# Patient Record
Sex: Female | Born: 1945 | Race: White | Hispanic: No | Marital: Married | Smoking: Current some day smoker
Health system: Southern US, Community
[De-identification: ages and names within clinical notes are randomized; demographics above are authoritative.]

## PROBLEM LIST (undated history)

## (undated) DIAGNOSIS — R413 Other amnesia: Secondary | ICD-10-CM

## (undated) DIAGNOSIS — M8580 Other specified disorders of bone density and structure, unspecified site: Secondary | ICD-10-CM

## (undated) DIAGNOSIS — J302 Other seasonal allergic rhinitis: Secondary | ICD-10-CM

## (undated) DIAGNOSIS — M5126 Other intervertebral disc displacement, lumbar region: Secondary | ICD-10-CM

## (undated) DIAGNOSIS — I10 Essential (primary) hypertension: Principal | ICD-10-CM

## (undated) DIAGNOSIS — K2970 Gastritis, unspecified, without bleeding: Secondary | ICD-10-CM

## (undated) DIAGNOSIS — J449 Chronic obstructive pulmonary disease, unspecified: Secondary | ICD-10-CM

## (undated) DIAGNOSIS — I712 Thoracic aortic aneurysm, without rupture: Secondary | ICD-10-CM

## (undated) DIAGNOSIS — E785 Hyperlipidemia, unspecified: Secondary | ICD-10-CM

## (undated) DIAGNOSIS — F17200 Nicotine dependence, unspecified, uncomplicated: Secondary | ICD-10-CM

## (undated) DIAGNOSIS — M720 Palmar fascial fibromatosis [Dupuytren]: Secondary | ICD-10-CM

## (undated) HISTORY — PX: CHOLECYSTECTOMY: SHX55

## (undated) HISTORY — DX: Gastritis, unspecified, without bleeding: K29.70

## (undated) HISTORY — DX: Other amnesia: R41.3

## (undated) HISTORY — DX: Other seasonal allergic rhinitis: J30.2

## (undated) HISTORY — DX: Thoracic aortic aneurysm, without rupture: I71.2

## (undated) HISTORY — DX: Chronic obstructive pulmonary disease, unspecified: J44.9

## (undated) HISTORY — DX: Essential (primary) hypertension: I10

## (undated) HISTORY — DX: Hyperlipidemia, unspecified: E78.5

## (undated) HISTORY — DX: Other specified disorders of bone density and structure, unspecified site: M85.80

## (undated) HISTORY — DX: Other intervertebral disc displacement, lumbar region: M51.26

## (undated) HISTORY — PX: ABDOMINAL HYSTERECTOMY: SHX81

## (undated) HISTORY — DX: Palmar fascial fibromatosis (dupuytren): M72.0

## (undated) HISTORY — PX: OTHER SURGICAL HISTORY: SHX169

## (undated) HISTORY — DX: Nicotine dependence, unspecified, uncomplicated: F17.200

---

## 1978-12-10 HISTORY — PX: BREAST CYST EXCISION: SHX579

## 1998-07-08 ENCOUNTER — Other Ambulatory Visit: Admission: RE | Admit: 1998-07-08 | Discharge: 1998-07-08 | Payer: Self-pay | Admitting: Obstetrics and Gynecology

## 1998-09-09 DIAGNOSIS — M5126 Other intervertebral disc displacement, lumbar region: Secondary | ICD-10-CM

## 1998-09-09 DIAGNOSIS — M51369 Other intervertebral disc degeneration, lumbar region without mention of lumbar back pain or lower extremity pain: Secondary | ICD-10-CM

## 1998-09-09 DIAGNOSIS — M5136 Other intervertebral disc degeneration, lumbar region: Secondary | ICD-10-CM

## 1998-09-09 HISTORY — DX: Other intervertebral disc degeneration, lumbar region without mention of lumbar back pain or lower extremity pain: M51.369

## 1998-09-09 HISTORY — DX: Other intervertebral disc degeneration, lumbar region: M51.36

## 1998-09-09 HISTORY — DX: Other intervertebral disc displacement, lumbar region: M51.26

## 1999-07-18 ENCOUNTER — Other Ambulatory Visit: Admission: RE | Admit: 1999-07-18 | Discharge: 1999-07-18 | Payer: Self-pay | Admitting: Obstetrics and Gynecology

## 1999-09-10 HISTORY — PX: OTHER SURGICAL HISTORY: SHX169

## 1999-10-10 ENCOUNTER — Encounter (INDEPENDENT_AMBULATORY_CARE_PROVIDER_SITE_OTHER): Payer: Self-pay | Admitting: Specialist

## 1999-10-11 ENCOUNTER — Inpatient Hospital Stay (HOSPITAL_COMMUNITY): Admission: RE | Admit: 1999-10-11 | Discharge: 1999-10-13 | Payer: Self-pay | Admitting: Obstetrics and Gynecology

## 2000-04-05 ENCOUNTER — Other Ambulatory Visit: Admission: RE | Admit: 2000-04-05 | Discharge: 2000-04-05 | Payer: Self-pay | Admitting: Family Medicine

## 2000-06-17 ENCOUNTER — Ambulatory Visit (HOSPITAL_BASED_OUTPATIENT_CLINIC_OR_DEPARTMENT_OTHER): Admission: RE | Admit: 2000-06-17 | Discharge: 2000-06-17 | Payer: Self-pay | Admitting: Family Medicine

## 2004-11-20 ENCOUNTER — Ambulatory Visit: Payer: Self-pay | Admitting: Family Medicine

## 2004-11-29 ENCOUNTER — Other Ambulatory Visit: Admission: RE | Admit: 2004-11-29 | Discharge: 2004-11-29 | Payer: Self-pay | Admitting: Family Medicine

## 2004-11-29 ENCOUNTER — Ambulatory Visit: Payer: Self-pay | Admitting: Family Medicine

## 2004-12-21 ENCOUNTER — Ambulatory Visit: Payer: Self-pay | Admitting: Family Medicine

## 2005-01-09 ENCOUNTER — Ambulatory Visit: Payer: Self-pay | Admitting: Family Medicine

## 2005-01-26 ENCOUNTER — Ambulatory Visit: Payer: Self-pay | Admitting: Family Medicine

## 2005-03-21 ENCOUNTER — Ambulatory Visit: Payer: Self-pay | Admitting: Unknown Physician Specialty

## 2005-07-17 ENCOUNTER — Ambulatory Visit: Payer: Self-pay | Admitting: Family Medicine

## 2006-02-12 ENCOUNTER — Encounter: Payer: Self-pay | Admitting: Family Medicine

## 2006-02-12 ENCOUNTER — Other Ambulatory Visit: Admission: RE | Admit: 2006-02-12 | Discharge: 2006-02-12 | Payer: Self-pay | Admitting: Family Medicine

## 2006-02-12 ENCOUNTER — Ambulatory Visit: Payer: Self-pay | Admitting: Family Medicine

## 2006-02-12 LAB — CONVERTED CEMR LAB: Pap Smear: NORMAL

## 2006-04-11 ENCOUNTER — Ambulatory Visit: Payer: Self-pay | Admitting: Family Medicine

## 2006-05-23 ENCOUNTER — Ambulatory Visit: Payer: Self-pay | Admitting: Family Medicine

## 2006-07-09 ENCOUNTER — Ambulatory Visit: Payer: Self-pay | Admitting: Family Medicine

## 2006-09-04 ENCOUNTER — Emergency Department: Payer: Self-pay | Admitting: Emergency Medicine

## 2006-09-04 ENCOUNTER — Other Ambulatory Visit: Payer: Self-pay

## 2006-09-06 ENCOUNTER — Ambulatory Visit: Payer: Self-pay | Admitting: Family Medicine

## 2006-09-23 ENCOUNTER — Ambulatory Visit: Payer: Self-pay

## 2006-11-22 ENCOUNTER — Ambulatory Visit: Payer: Self-pay | Admitting: Family Medicine

## 2007-03-10 ENCOUNTER — Ambulatory Visit: Payer: Self-pay | Admitting: Family Medicine

## 2007-03-20 ENCOUNTER — Ambulatory Visit: Payer: Self-pay | Admitting: Family Medicine

## 2007-03-24 ENCOUNTER — Encounter: Payer: Self-pay | Admitting: Family Medicine

## 2007-03-24 DIAGNOSIS — J449 Chronic obstructive pulmonary disease, unspecified: Secondary | ICD-10-CM | POA: Insufficient documentation

## 2007-03-24 DIAGNOSIS — M949 Disorder of cartilage, unspecified: Secondary | ICD-10-CM

## 2007-03-24 DIAGNOSIS — J309 Allergic rhinitis, unspecified: Secondary | ICD-10-CM | POA: Insufficient documentation

## 2007-03-24 DIAGNOSIS — M899 Disorder of bone, unspecified: Secondary | ICD-10-CM | POA: Insufficient documentation

## 2007-03-24 DIAGNOSIS — E785 Hyperlipidemia, unspecified: Secondary | ICD-10-CM | POA: Insufficient documentation

## 2007-03-24 DIAGNOSIS — G4730 Sleep apnea, unspecified: Secondary | ICD-10-CM | POA: Insufficient documentation

## 2007-03-24 DIAGNOSIS — N6019 Diffuse cystic mastopathy of unspecified breast: Secondary | ICD-10-CM | POA: Insufficient documentation

## 2007-03-24 DIAGNOSIS — F17200 Nicotine dependence, unspecified, uncomplicated: Secondary | ICD-10-CM | POA: Insufficient documentation

## 2007-06-03 ENCOUNTER — Ambulatory Visit: Payer: Self-pay | Admitting: Internal Medicine

## 2007-06-03 ENCOUNTER — Telehealth (INDEPENDENT_AMBULATORY_CARE_PROVIDER_SITE_OTHER): Payer: Self-pay | Admitting: *Deleted

## 2007-08-16 ENCOUNTER — Emergency Department: Payer: Self-pay | Admitting: Emergency Medicine

## 2007-08-16 ENCOUNTER — Other Ambulatory Visit: Payer: Self-pay

## 2008-12-17 ENCOUNTER — Telehealth: Payer: Self-pay | Admitting: Family Medicine

## 2009-01-23 ENCOUNTER — Ambulatory Visit: Payer: Self-pay | Admitting: Internal Medicine

## 2009-03-31 ENCOUNTER — Encounter: Payer: Self-pay | Admitting: Family Medicine

## 2009-03-31 ENCOUNTER — Other Ambulatory Visit: Admission: RE | Admit: 2009-03-31 | Discharge: 2009-03-31 | Payer: Self-pay | Admitting: Family Medicine

## 2009-03-31 ENCOUNTER — Ambulatory Visit: Payer: Self-pay | Admitting: Family Medicine

## 2009-03-31 DIAGNOSIS — D126 Benign neoplasm of colon, unspecified: Secondary | ICD-10-CM | POA: Insufficient documentation

## 2009-04-06 ENCOUNTER — Encounter (INDEPENDENT_AMBULATORY_CARE_PROVIDER_SITE_OTHER): Payer: Self-pay | Admitting: *Deleted

## 2009-04-12 LAB — CONVERTED CEMR LAB
ALT: 19 units/L (ref 0–35)
CO2: 25 meq/L (ref 19–32)
Calcium: 10 mg/dL (ref 8.4–10.5)
Chloride: 105 meq/L (ref 96–112)
Cholesterol: 231 mg/dL — ABNORMAL HIGH (ref 0–200)
Lymphocytes Relative: 25 % (ref 12–46)
Lymphs Abs: 2.8 10*3/uL (ref 0.7–4.0)
Monocytes Relative: 6 % (ref 3–12)
Neutro Abs: 7.7 10*3/uL (ref 1.7–7.7)
Neutrophils Relative %: 69 % (ref 43–77)
Potassium: 4.8 meq/L (ref 3.5–5.3)
RBC: 5.61 M/uL — ABNORMAL HIGH (ref 3.87–5.11)
Sodium: 141 meq/L (ref 135–145)
Total Bilirubin: 0.6 mg/dL (ref 0.3–1.2)
Total Protein: 7.5 g/dL (ref 6.0–8.3)
VLDL: 34 mg/dL (ref 0–40)
Vit D, 25-Hydroxy: 75 ng/mL (ref 30–89)
WBC: 11.2 10*3/uL — ABNORMAL HIGH (ref 4.0–10.5)

## 2009-04-19 ENCOUNTER — Encounter: Payer: Self-pay | Admitting: Family Medicine

## 2009-06-01 ENCOUNTER — Ambulatory Visit: Payer: Self-pay | Admitting: Family Medicine

## 2009-06-07 LAB — CONVERTED CEMR LAB
BUN: 14 mg/dL (ref 6–23)
Chloride: 105 meq/L (ref 96–112)
Eosinophils Relative: 3 % (ref 0–5)
HCT: 48.4 % — ABNORMAL HIGH (ref 36.0–46.0)
HDL: 38 mg/dL — ABNORMAL LOW (ref >39)
LDL Cholesterol: 86 mg/dL (ref 0–99)
Lymphocytes Relative: 30 % (ref 12–46)
Lymphs Abs: 2.1 10*3/uL (ref 0.7–4.0)
Platelets: 265 10*3/uL (ref 150–400)
Potassium: 4.5 meq/L (ref 3.5–5.3)
Sodium: 140 meq/L (ref 135–145)
Total CHOL/HDL Ratio: 4
Triglycerides: 143 mg/dL (ref <150)
VLDL: 29 mg/dL (ref 0–40)
WBC: 7 10*3/uL (ref 4.0–10.5)

## 2009-09-07 ENCOUNTER — Encounter: Payer: Self-pay | Admitting: Family Medicine

## 2009-10-13 ENCOUNTER — Ambulatory Visit: Payer: Self-pay | Admitting: Unknown Physician Specialty

## 2009-10-13 ENCOUNTER — Encounter: Payer: Self-pay | Admitting: Family Medicine

## 2009-10-13 LAB — HM COLONOSCOPY

## 2010-08-29 ENCOUNTER — Telehealth (INDEPENDENT_AMBULATORY_CARE_PROVIDER_SITE_OTHER): Payer: Self-pay | Admitting: *Deleted

## 2010-08-31 ENCOUNTER — Ambulatory Visit: Payer: Self-pay | Admitting: Family Medicine

## 2010-09-03 LAB — CONVERTED CEMR LAB
ALT: 22 units/L (ref 0–35)
AST: 22 units/L (ref 0–37)
Basophils Relative: 0.2 % (ref 0.0–3.0)
Bilirubin, Direct: 0.1 mg/dL (ref 0.0–0.3)
Chloride: 113 meq/L — ABNORMAL HIGH (ref 96–112)
Direct LDL: 146.9 mg/dL
Eosinophils Relative: 2.2 % (ref 0.0–5.0)
GFR calc non Af Amer: 72.47 mL/min (ref >60)
HCT: 47.9 % — ABNORMAL HIGH (ref 36.0–46.0)
Lymphs Abs: 2.2 10*3/uL (ref 0.7–4.0)
MCV: 86.8 fL (ref 78.0–100.0)
Monocytes Absolute: 0.6 10*3/uL (ref 0.1–1.0)
Monocytes Relative: 6.5 % (ref 3.0–12.0)
Neutrophils Relative %: 66.5 % (ref 43.0–77.0)
Potassium: 6 meq/L — ABNORMAL HIGH (ref 3.5–5.1)
RBC: 5.51 M/uL — ABNORMAL HIGH (ref 3.87–5.11)
Total CHOL/HDL Ratio: 5
Total Protein: 7.1 g/dL (ref 6.0–8.3)
VLDL: 46.8 mg/dL — ABNORMAL HIGH (ref 0.0–40.0)
WBC: 9 10*3/uL (ref 4.5–10.5)

## 2010-09-04 ENCOUNTER — Ambulatory Visit: Payer: Self-pay | Admitting: Family Medicine

## 2010-09-04 DIAGNOSIS — E875 Hyperkalemia: Secondary | ICD-10-CM | POA: Insufficient documentation

## 2010-09-04 DIAGNOSIS — R7309 Other abnormal glucose: Secondary | ICD-10-CM | POA: Insufficient documentation

## 2010-09-04 DIAGNOSIS — M720 Palmar fascial fibromatosis [Dupuytren]: Secondary | ICD-10-CM | POA: Insufficient documentation

## 2010-09-06 LAB — CONVERTED CEMR LAB
BUN: 15 mg/dL
CO2: 28 meq/L
Calcium: 9.4 mg/dL
Chloride: 108 meq/L
Creatinine, Ser: 0.64 mg/dL
Glucose, Bld: 97 mg/dL
Hgb A1c MFr Bld: 5.8 % — ABNORMAL HIGH
Potassium: 5.1 meq/L
Sodium: 143 meq/L

## 2010-10-02 ENCOUNTER — Ambulatory Visit: Payer: Self-pay | Admitting: Family Medicine

## 2010-10-02 DIAGNOSIS — R1013 Epigastric pain: Secondary | ICD-10-CM | POA: Insufficient documentation

## 2010-10-03 LAB — CONVERTED CEMR LAB
ALT: 16 units/L (ref 0–35)
Basophils Absolute: 0 10*3/uL (ref 0.0–0.1)
Basophils Relative: 0 % (ref 0–1)
Bilirubin, Direct: 0.1 mg/dL (ref 0.0–0.3)
Eosinophils Absolute: 0.2 10*3/uL (ref 0.0–0.7)
Indirect Bilirubin: 0.4 mg/dL (ref 0.0–0.9)
Lipase: 98 units/L — ABNORMAL HIGH (ref 0–75)
MCHC: 32.7 g/dL (ref 30.0–36.0)
MCV: 87.6 fL (ref 78.0–100.0)
Monocytes Relative: 8 % (ref 3–12)
Neutro Abs: 6.7 10*3/uL (ref 1.7–7.7)
Neutrophils Relative %: 70 % (ref 43–77)
Platelets: 307 10*3/uL (ref 150–400)
RBC: 5.31 M/uL — ABNORMAL HIGH (ref 3.87–5.11)
RDW: 12.9 % (ref 11.5–15.5)
Total Bilirubin: 0.5 mg/dL (ref 0.3–1.2)

## 2010-10-12 ENCOUNTER — Ambulatory Visit: Payer: Self-pay | Admitting: Internal Medicine

## 2010-10-12 ENCOUNTER — Telehealth: Payer: Self-pay | Admitting: Family Medicine

## 2010-10-12 DIAGNOSIS — J209 Acute bronchitis, unspecified: Secondary | ICD-10-CM | POA: Insufficient documentation

## 2011-01-09 ENCOUNTER — Telehealth: Payer: Self-pay | Admitting: Family Medicine

## 2011-01-09 NOTE — Assessment & Plan Note (Signed)
Summary: CPX/CLE   Vital Signs:  Patient profile:   65 year old female Height:      63.5 inches Weight:      162 pounds BMI:     28.35 Temp:     97.8 degrees F oral Pulse rate:   88 / minute Pulse rhythm:   regular BP sitting:   128 / 76  (left arm) Cuff size:   regular  Vitals Entered By: Lewanda Rife LPN (September 04, 2010 10:40 AM) CC: CPX LMP hyst   History of Present Illness: here for wellness exam and to review chronic med problems  feeling pretty good -- stressed and tired in general as usual  [place] an important part of her life  is down about it - but it seems surreal   is really frustrated   has hx of opthy migraine - and now occasionally has pressure/ drawing feeling over eyes - brief at times  gets aura without headache  then brief double vision    wt is up 10 lb with bmi of 28  lipids are a bit high with trig 234 and HDL 39 and LDL 146  (up significantly) is not eating well -- is eating convenience foods (after a wt loss effort of cutting back)  last 2 weeks -- off the wagon    smoking status -- has cut back to 1/2ppd - then up to 1pack per stress   osteopenia with worse dexa in 5/10 ca and D vit D level is 43  HB is 16.5 - smoker   hyst in past for fibroids  pap nl 4/10-- no symptoms or problem   mam was at [facility] was fine spring or summer self exam - fibrocystic / cysts drained in the past    TD 07 ptx 2010 flu shot --will get at [facility] work zoster status --? if can afford  colonosc 11/10- nl but needs 5 y f/u for hx of polyps  electrolytes out of whatck with na 151 and K of 6  is drinking a good amt of fluids - 6 servings of fluid per day  sugar 118   problems with her hand -knots in palms L wrist hurts occas   needs a tranquilzer for stress for emergencies      Allergies (verified): 1)  ! Codeine  Past History:  Past Surgical History: Last updated:  10/27/2009 Cholecystectomy Hysterectomy- fibroids (09/1999) L3-L4, L4-L5 bulging disks (09/1998) 2 breast cyst aspirations Sleep study- mild apnea (06/2000) Osteopenia- Dexa (09/2002) Dexa- osteopenia worse (12/2004) EGD- gastritis (03/2005) Colonoscopy- polyps, hemorrhoids Nuclear stress test- neg. (09/2006) dexa (5/10)- osteopenia- slt worse  11/10 EGD- gastritis 11/10 colonoscopy - diverticulosis - re check 5 y  Family History: Last updated: 03/24/2007 mother breast ca, CAD  Social History: Last updated: 03/31/2009 Patient currently smokes.  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 Eyes:  Denies blurring and eye irritation. CV:  Denies chest pain or discomfort, palpitations, shortness of breath with exertion, and swelling of feet. Resp:  Denies cough, shortness of breath, and wheezing. GI:  Denies abdominal pain, bloody stools, and change in bowel habits. GU:  Denies dysuria and urinary frequency. MS:  Denies joint pain, joint redness, and joint swelling. Derm:  Denies lesion(s), poor wound healing, and rash. Neuro:  Denies numbness and tingling. Psych:  Denies anxiety and depression. Endo:  Denies cold intolerance, excessive thirst, excessive urination, and heat intolerance. Heme:  Denies abnormal bruising and bleeding.  Physical Exam  General:  Well-developed,well-nourished,in no acute distress; alert,appropriate and cooperative throughout examination Head:  normocephalic, atraumatic, and no abnormalities observed.   Eyes:  vision grossly intact, pupils equal, pupils round, and pupils reactive to light.  no conjunctival pallor, injection or icterus  Ears:  R ear normal and L  ear normal.   Nose:  no nasal discharge.   Mouth:  pharynx pink and moist.   Neck:  supple with full rom and no masses or thyromegally, no JVD or carotid bruit  Chest Wall:  No deformities, masses, or tenderness noted. Breasts:  No mass, nodules, thickening, tenderness, bulging, retraction, inflamation, nipple discharge or skin changes noted.   Lungs:  diffusely distant bs with fair air exch no rales/rhonchi or wheeze  Heart:  Normal rate and regular rhythm. S1 and S2 normal without gallop, murmur, click, rub or other extra sounds. Abdomen:  Bowel sounds positive,abdomen soft and non-tender without masses, organomegaly or hernias noted. no renal bruits  Msk:  changes in palms with hardening of tissue over tendons no acute joint changes  Pulses:  R and L carotid,radial,femoral,dorsalis pedis and posterior tibial pulses are full and equal bilaterally Extremities:  No clubbing, cyanosis, edema, or deformity noted with normal full range of motion of all joints.   Neurologic:  sensation intact to light touch, gait normal, and DTRs symmetrical and normal.   Skin:  Intact without suspicious lesions or rashes Cervical Nodes:  No lymphadenopathy noted Inguinal Nodes:  No significant adenopathy Psych:  normal affect, talkative and pleasant    Impression & Recommendations:  Problem # 1:  HEALTH MAINTENANCE EXAM (ICD-V70.0) Assessment Comment Only reviewed health habits including diet, exercise and skin cancer prevention reviewed health maintenance list and family history adv to quit smoking/ get back to healthy diet and exercise  rev labs in detail  Problem # 2:  HYPERKALEMIA (ICD-276.7) Assessment: New odd electrolyte profile-- ? lab error- no reason for high na and K re check these  Orders: Venipuncture ([address]) Specimen Handling (60454) T-Basic Metabolic Panel ([id number])  Problem # 3:  HYPERGLYCEMIA (ICD-790.29) Assessment: New fasting sugar 118- worse diet lately check AIC  today Orders: Venipuncture (29562) Specimen Handling (13086) T- Hemoglobin A1C ([id number])  Problem # 4:  Hx of BREAST CANCER, FAMILY HX (ICD-V16.3) Assessment: Comment Only will send for mam no changes in exam today  Problem # 5:  Hx of TOBACCO ABUSE (ICD-305.1) Assessment: Unchanged discussed in detail risks of smoking, and possible outcomes including COPD, vascular dz, cancer and also respiratory infections/sinus problems   pt aware of risks  not willing to quit at this time  Problem # 6:  HYPERLIPIDEMIA (ICD-272.4) Assessment: Deteriorated  this is worse with poor diet rev low sat fat diet to follow will continue to monitor  also plans to get back to exercise  Labs Reviewed: SGOT: 22 (08/31/2010)   SGPT: 22 (08/31/2010)   HDL:39.90 (08/31/2010), 38 (06/01/2009)  LDL:86 (06/01/2009), 156 (28/41/3244)  Chol:219 (08/31/2010), 153 (06/01/2009)  Trig:234.0 (08/31/2010), 143 (06/01/2009)  Problem # 7:  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 One half to one by mouth three times a day as needed 2)  Osteo Bi-flex Adv Double St Caps (Misc natural products) .... As needed 3)  Celebrex 200 Mg Caps (Celecoxib) .Marland Kitchen.. 1 by mouth once daily with food as needed 4)  Alprazolam 0.5 Mg Tabs (Alprazolam) .Marland Kitchen.. 1 by mouth once daily as needed severe anxiety  Patient Instructions: 1)  if you are interested in shingles shot -- please call your ins co to check on coverage and let us know if you want one 2)  re checking labs today 3)  please send for last mammogram report from [facility]  4)  don't forget to see your eye doctor for your eye symptoms 5)  cut the caffiene and drink more water  6)  re checking electrolytes and also AIC (diabetes test) today  7)  keep thinking about quitting smoking  8)   you can raise your HDL (good cholesterol) by increasing exercise and eating omega 3 fatty acid supplement like fish oil or flax seed oil over the counter 9)  you can lower LDL (bad cholesterol) by limiting saturated fats in diet like red meat, fried foods, egg yolks, fatty breakfast meats, high fat dairy products and shellfish  Prescriptions: ALPRAZOLAM 0.5 MG TABS (ALPRAZOLAM) 1 by mouth once daily as needed severe anxiety  #20 x 0   Entered and Authorized by:   Judith Part MD   Signed by:   Judith Part MD on 09/04/2010   Method used:   Print then Give to Patient   RxID:   [id number]   Current Allergies (reviewed today): ! CODEINE

## 2011-01-09 NOTE — Assessment & Plan Note (Signed)
Summary: upper stomach pain//low grade fever//lch   Vital Signs:  Patient profile:   65 year old female Height:      63.5 inches Weight:      157 pounds BMI:     27.47 Temp:     98.3 degrees F oral Pulse rate:   80 / minute Pulse rhythm:   regular BP sitting:   140 / 62  (left arm) Cuff size:   regular  Vitals Entered By: Linde Gillis CMA Duncan Dull) (October 02, 2010 9:09 AM) CC: stomach pain   History of Present Illness: 65 yo new to me here for stomach pain.  Has a h/o reflux but feels symptoms are worse than usual. Taking her Prilosec two times a day and Xantac which helps a little. Burping helps the most. Symptoms are epigastric and go up her throat but does not taste the acid that she usually does with her reflux. Has had more stress lately, [place].  Has been also been drinking a lot of diet drinks and she is intolerant of Splenda, causes GI upset reflux. No nausea or vomiting. No diarrhea. Did have some body aches over the weekend, ?fever Tmax 98.8.  symptoms are gradually getting better.      Allergies: 1)  ! Codeine  Review of Systems      See HPI General:  Complains of fever and malaise. GI:  Complains of gas and indigestion; denies bloody stools, change in bowel habits, nausea, and vomiting.  Physical Exam  General:  Well-developed,well-nourished,in no acute distress; alert,appropriate and cooperative throughout examination Abdomen:  Bowel sounds positive,abdomen soft and non-tender without masses, organomegaly or hernias noted. no renal bruits  Extremities:  No clubbing, cyanosis, edema, or deformity noted with normal full range of motion of all joints.   Psych:  normal affect, talkative and pleasant    Impression & Recommendations:  Problem # 1:  EPIGASTRIC PAIN (ICD-789.06) Assessment New Most consistent with GERD/PUD.  Advised increasing her PPI and if symptoms persist, needs follow up with her gastroenterologist to  repeat her EGD. Pt in agreement with plan. Will check Lipase, hepatic panel to rule out other causes that are less likely. Orders: Venipuncture ([id number]) Specimen Handling (82956) T-Lipase [phone]) T-Hepatic Function [phone]) T-CBC w/Diff [phone])  Complete Medication List: 1)  Cyclobenzaprine Hcl 10 Mg Tabs (Cyclobenzaprine hcl) .... One half to one by mouth three times a day as needed 2)  Osteo Bi-flex Adv Double St Caps (Misc natural products) .... As needed 3)  Celebrex 200 Mg Caps (Celecoxib) .Marland Kitchen.. 1 by mouth once daily with food as needed 4)  Alprazolam 0.5 Mg Tabs (Alprazolam) .Marland Kitchen.. 1 by mouth once daily as needed severe anxiety   Orders Added: 1)  Venipuncture [53664] 2)  Specimen Handling [99000] 3)  T-Lipase [[id number]] 4)  T-Hepatic Function [[id number]] 5)  T-CBC w/Diff [[id number]] 6)  Est. Patient Level IV [63875]    Current Allergies (reviewed today): ! CODEINE

## 2011-01-09 NOTE — Progress Notes (Signed)
----   Converted from flag ---- ---- 08/28/2010 7:32 PM, Colon Flattery Tower MD wrote: please check wellness/ lipid/ vit D v70.0 and 272, and 733.0 thanks   ---- 08/28/2010 12:18 PM, Liane Comber CMA (AAMA) wrote: Lab orders please! Good Morning! This pt is scheduled for cpx labs [place], which labs to draw and dx codes to use? Thanks Tasha ------------------------------

## 2011-01-09 NOTE — Progress Notes (Signed)
Summary: wants zpak  Phone Note Call from Patient Call back at Home Phone [phone]   Caller: Patient Call For: Judith Part MD Summary of Call: Patient has had low grade fever, coughing, and head congestion. She has been taking mucinex. She is asking if you could call in zpak. I explained to her that we do not treat with antibiotics without office visit, I offered her somthing for today. She says that any time she get symptoms like this it always turns into bronchitis and that she feels like she needs to start this early. Again I told her that we would need to set up an appt. and she told me to ask you about the antibiotic first. Uses [facility] if needed.  Initial call taken by: Melody Comas,  October 12, 2010 8:35 AM  Follow-up for Phone Call        sorry- cannot tx with abx without visit  Follow-up by: Judith Part MD,  October 12, 2010 9:00 AM  Additional Follow-up for Phone Call Additional follow up Details #1::        Called patient to let her know that she will need an appt. Scheduled appt. for today at 2:00.  Additional Follow-up by: Melody Comas,  October 12, 2010 9:14 AM

## 2011-01-09 NOTE — Assessment & Plan Note (Signed)
Summary: cough,congestion/alc   Vital Signs:  Patient profile:   65 year old female Weight:      159 pounds O2 Sat:      96 % on Room air Temp:     98.9 degrees F oral Pulse rate:   106 / minute Pulse rhythm:   regular BP sitting:   160 / 70  (left arm) Cuff size:   regular  Vitals Entered By: Selena Batten Dance CMA (AAMA) (October 12, 2010 2:03 PM)  O2 Flow:  Room air CC: Cough/Congestion   History of Present Illness: CC: cough/cold  2d h/o dry raspy cough, with phlegm taste.  + ST, raw throat.  Took mucinex yesterday, coughed up some phlegm.  Low grade fever last night to 99.6.  Hasn't tried anything other than mucinex.  Not more SOB than normal.  More sputum production and cough than normal.  Husband has been sick.  Pt smoking x >50 years, 1/3 ppd, has not cut back yet "not feeling bad enough".  usually gets bronchitis in spring, has been a while since had this.  No abd pain, n/v, rashes, joint pain, myalgias.  Feels needs Zpack within 12 hours of foul taste because if not invariably gets bronchitis.  Has grand daughter with autoimmune condition so she feels she needs to stay healthy.  Current Medications (verified): 1)  Cyclobenzaprine Hcl 10 Mg Tabs (Cyclobenzaprine Hcl) .... One Half To One By Mouth Three Times A Day As Needed 2)  Osteo Bi-Flex Adv Double St  Caps (Misc Natural Products) .... As Needed 3)  Celebrex 200 Mg Caps (Celecoxib) .Marland Kitchen.. 1 By Mouth Once Daily With Food As Needed 4)  Alprazolam 0.5 Mg Tabs (Alprazolam) .Marland Kitchen.. 1 By Mouth Once Daily As Needed Severe Anxiety  Allergies: 1)  ! 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  has 5 year old twin grandkids   Review of Systems       per HPI  Physical Exam  General:   Well-developed,well-nourished,in no acute distress; alert,appropriate and cooperative throughout examination, comfortable.  Head:  normocephalic, atraumatic, and no abnormalities observed.   Eyes:  vision grossly intact, pupils equal, pupils round, and pupils reactive to light.  no conjunctival pallor, injection or icterus  Ears:  R ear normal and L ear normal.   Nose:  no nasal discharge.   Mouth:  pharynx pink and moist.   Neck:  supple with full rom and no masses or thyromegally, no JVD or carotid bruit  Lungs:  coarse sounding throughout.  mild exp wheezing. Heart:  Normal rate and regular rhythm. S1 and S2 normal without gallop, murmur, click, rub or other extra sounds. Pulses:  2+ radpulses Extremities:  brisk cap refill Skin:  Intact without suspicious lesions or rashes   Impression & Recommendations:  Problem # 1:  ACUTE BRONCHITIS (ICD-466.0) given h/o smoking and lung findings, likely has early COPD.  + increased cough and sputum.  treat with abx. red flags to return discussed.  Her updated medication list for this problem includes:    Zithromax Z-pak 250 Mg Tabs (Azithromycin) .Marland Kitchen... Take as directed  Complete Medication List: 1)  Cyclobenzaprine Hcl 10 Mg Tabs (Cyclobenzaprine hcl) .... One half to one by mouth three times a day as needed 2)  Osteo Bi-flex Adv  Double St Caps (Misc natural products) .... As needed 3)  Celebrex 200 Mg Caps (Celecoxib) .Marland Kitchen.. 1 by mouth once daily with food as needed 4)  Alprazolam 0.5 Mg Tabs (Alprazolam) .Marland Kitchen.. 1 by mouth once daily as needed severe anxiety 5)  Zithromax Z-pak 250 Mg Tabs (Azithromycin) .... Take as directed  Patient Instructions: 1)  You may have early bronchitis.  Given your smoking history, we will treat you early for this.   2)  Use medication as prescribed: zpack. 3)  Continue delsym 4)  Please return if you are not improving as expected, or if you have high fevers (>101.5) or difficulty breathing, or more productive  cough. 5)  Call clinic with questions.  Pleasure to see you today!  Prescriptions: ZITHROMAX Z-PAK 250 MG TABS (AZITHROMYCIN) take as directed  #1 x 0   Entered and Authorized by:   Eustaquio Boyden  MD   Signed by:   Eustaquio Boyden  MD on 10/12/2010   Method used:   Electronically to        [facility]* (retail)       [address] [place], [place]  [address]       Ph: [phone]       Fax: [phone]   RxID:   [id number]    Orders Added: 1)  Est. Patient Level III [84132]    Current Allergies (reviewed today): ! CODEINE

## 2011-01-17 NOTE — Progress Notes (Signed)
Summary: Wants motion sickness patches  Phone Note Call from Patient Call back at [phone]   Caller: Patient Call For: Judith Part MD Summary of Call: Pt is going on cruise next wk for 8 days. Pt wants patches that go behind the ear for motion sickness. Pt uses [facility] and pt can be reached at [phone].Please advise.  Initial call taken by: Lewanda Rife LPN,  January 09, 2011 9:48 AM  Follow-up for Phone Call        px written on EMR for call in for scopalamine patch watch out for sedation with this  Follow-up by: Judith Part MD,  January 09, 2011 10:42 AM  Additional Follow-up for Phone Call Additional follow up Details #1::        Med sent electronically to [facility] and Patient notified as instructed by telephone. Lewanda Rife LPN  January 09, 2011 10:46 AM     New/Updated Medications: TRANSDERM-SCOP 1.5 MG PT72 (SCOPOLAMINE BASE) apply one patch every 3 days as needed motion sickness Prescriptions: TRANSDERM-SCOP 1.5 MG PT72 (SCOPOLAMINE BASE) apply one patch every 3 days as needed motion sickness  #10 x 0   Entered by:   Lewanda Rife LPN   Authorized by:   Judith Part MD   Signed by:   Lewanda Rife LPN on 19/14/7829   Method used:   Electronically to        [facility]* (retail)       [address] [place], [place]  [address]       Ph: [phone]       Fax: [phone]   RxID:   [id number]

## 2011-06-25 ENCOUNTER — Telehealth: Payer: Self-pay | Admitting: *Deleted

## 2011-06-25 MED ORDER — CYCLOBENZAPRINE HCL 10 MG PO TABS: 10.0000 mg | ORAL_TABLET | Freq: Three times a day (TID) | ORAL | Status: DC | PRN

## 2011-06-25 NOTE — Telephone Encounter (Signed)
I can call in short amt of flexeril - otherwise needs appt  If not imp in 2 d - follow up or if worse or any weakness or numbness

## 2011-06-25 NOTE — Telephone Encounter (Signed)
Pt has pulled a muscle in her back carrying her grandson.  She is asking that tramadol and flexeril be called to [facility].  I advised her that she may need office visit first.

## 2011-06-25 NOTE — Telephone Encounter (Signed)
Patient notified as instructed by telephone. Medication phoned to  [facility] as instructed.

## 2011-07-11 DIAGNOSIS — I712 Thoracic aortic aneurysm, without rupture, unspecified: Secondary | ICD-10-CM

## 2011-07-11 HISTORY — DX: Thoracic aortic aneurysm, without rupture: I71.2

## 2011-07-11 HISTORY — DX: Thoracic aortic aneurysm, without rupture, unspecified: I71.20

## 2011-07-24 ENCOUNTER — Ambulatory Visit (INDEPENDENT_AMBULATORY_CARE_PROVIDER_SITE_OTHER): Payer: Medicare Other | Admitting: Family Medicine

## 2011-07-24 ENCOUNTER — Ambulatory Visit (INDEPENDENT_AMBULATORY_CARE_PROVIDER_SITE_OTHER)
Admission: RE | Admit: 2011-07-24 | Discharge: 2011-07-24 | Disposition: A | Discharge status: Home / Self Care | Payer: Medicare Other | Source: Ambulatory Visit | Attending: Family Medicine | Admitting: Family Medicine

## 2011-07-24 ENCOUNTER — Encounter: Payer: Self-pay | Admitting: Family Medicine

## 2011-07-24 DIAGNOSIS — F17200 Nicotine dependence, unspecified, uncomplicated: Secondary | ICD-10-CM

## 2011-07-24 DIAGNOSIS — R05 Cough: Secondary | ICD-10-CM

## 2011-07-24 DIAGNOSIS — M549 Dorsalgia, unspecified: Secondary | ICD-10-CM | POA: Insufficient documentation

## 2011-07-24 DIAGNOSIS — R059 Cough, unspecified: Secondary | ICD-10-CM

## 2011-07-24 MED ORDER — CYCLOBENZAPRINE HCL 10 MG PO TABS: 10.0000 mg | ORAL_TABLET | Freq: Three times a day (TID) | ORAL | Status: AC | PRN

## 2011-07-24 NOTE — Progress Notes (Signed)
Subjective:    Patient ID: Ann Wu, female    DOB: 1946/04/21, 65 y.o.   MRN: [id number]  HPI Is having some L side pain Thinks she pulled muscle under L arm  Hurts to lie on that side for 4 weeks  Then a spot started to become tender to the touch (no knot and no rash )  Persisted and moved into her mid back and axilla and around shoulder blade  This am also into her L neck Used some biofreeze and some ibuprofen   (celebrex did not help --never takes the 2 together )   Had been coughing for 3 weeks prior to this -- ? If cough was the cause  Not wheezy but irritated  Feels like sinus drainage or phlegm in throat  Did start with a mild cold with drainage (mucinex helped)  Not coughing anything up except for once  No wheeze  Not sob  Smoking -- has cut down to 1/2ppd at the most  Not ready to set a date yet but doing well   Working on wt loss - intentional - by our scale 14 lb  Has mt for 6 months  Cutting back on eating -- protein bar in am and 1/2 sandwhich for lunch and dinner - smaller portions  Still eats occ cookie   Patient Active Problem List  Diagnoses  . COLONIC POLYPS  . HYPERLIPIDEMIA  . HYPERKALEMIA  . TOBACCO ABUSE  . ACUTE BRONCHITIS  . ALLERGIC RHINITIS  . COPD  . FIBROCYSTIC BREAST DISEASE  . DUPUYTREN'S CONTRACTURE  . OSTEOPENIA  . SLEEP APNEA, MILD  . EPIGASTRIC PAIN  . HYPERGLYCEMIA  . Cough  . Back pain   Past Medical History  Diagnosis Date  . Allergic rhinitis, seasonal     mild  . COPD (chronic obstructive pulmonary disease)     likely per CXR  . Hyperlipidemia   . Osteopenia   . Smoker   . Dupuytren's contracture of hand   . Bulging lumbar disc 10/99    L 3-4, L 4-5  . Osteopenia     dexa 2003, worse 2006, slighty worse 2010  . Gastritis     EGD 03/2005, 11/10   Past Surgical History  Procedure Date  . Cholecystectomy   . Gyn surgery 09/1999    hysterectomy- fibroids  . Cyst aspirations      x 2- breast   History    Substance Use Topics  . Smoking status: Current Everyday Smoker  . Smokeless tobacco: Not on file  . Alcohol Use: Not on file   Family History  Problem Relation Age of Onset  . Cancer Mother     breast  . Coronary artery disease Mother    Allergies  Allergen Reactions  . Codeine     REACTION: nausea and vomiting   Current Outpatient Prescriptions on File Prior to Visit  Medication Sig Dispense Refill  . cyclobenzaprine (FLEXERIL) 10 MG tablet Take 1 tablet (10 mg total) by mouth every 8 (eight) hours as needed for muscle spasms (caution of sedation).  10 tablet  0           Review of Systems Review of Systems  Constitutional: Negative for fever, appetite change, fatigue and unexpected weight change.  Eyes: Negative for pain and visual disturbance.  Respiratory: neg for sob/ wheeze/ pos for cough   Cardiovascular: Negative. For palpitations/ edema or sob  Gastrointestinal: Negative for nausea, diarrhea and constipation.  Genitourinary: Negative for urgency  and frequency.  Skin: Negative for pallor.  MSK pos for back and neck and side pain  Neurological: Negative for weakness, light-headedness, numbness and headaches.  Hematological: Negative for adenopathy. Does not bruise/bleed easily.  Psychiatric/Behavioral: Negative for dysphoric mood. The patient is not nervous/anxious.          Objective:   Physical Exam  Constitutional: She appears well-developed and well-nourished.       Wt loss noted  Well appearing   HENT:  Head: Normocephalic and atraumatic.  Right Ear: External ear normal.  Left Ear: External ear normal.  Nose: Nose normal.  Mouth/Throat: Oropharynx is clear and moist.       Some clear posst nasal drip  Eyes: Conjunctivae and EOM are normal. Pupils are equal, round, and reactive to light. Right eye exhibits no discharge. Left eye exhibits no discharge.  Neck: Normal range of motion. Neck supple. No JVD present. Carotid bruit is not present. No  thyromegaly present.       Nl rom of neck No bony or muscular tenderness   Cardiovascular: Normal rate, regular rhythm and normal heart sounds.   Pulmonary/Chest: Breath sounds normal. No respiratory distress. She has no rales. She exhibits tenderness.       Diffusely distant bs  Harsh at bilat bases with scant wheeze on forced exp only  Cough is harsh sounding  Tubular bs louder at L base also   Some lateral lower L rib tenderness without crepitance or skin change   Abdominal: Soft. Bowel sounds are normal. She exhibits no distension and no mass. There is no tenderness.  Musculoskeletal: Normal range of motion. She exhibits tenderness. She exhibits no edema.       Tenderness in musculature medial to L scapula and L trapezius No bony tenderness  Lymphadenopathy:    She has no cervical adenopathy.  Neurological: She is alert. She has normal reflexes. No cranial nerve deficit.  Skin: Skin is warm and dry. No rash noted. No erythema. No pallor.  Psychiatric: She has a normal mood and affect.          Assessment & Plan:

## 2011-07-24 NOTE — Patient Instructions (Signed)
Take flexeril as needed (watch for sedation ) for your side pain Continue stretching and use heat as needed (10 minutes at a time)  Anti inflammatory only as tolerated  Chest x ray today -- will update you tomorrow with result Start taking prilosec regularly for the next 2 weeks

## 2011-07-24 NOTE — Assessment & Plan Note (Addendum)
Cough for 3 weeks in smoker who also has gerd  Non productive  Harsh bs on exam  Adv to re start prilosec regularly  Chest x ray today and update

## 2011-07-24 NOTE — Assessment & Plan Note (Signed)
Commended on cutting down and plan to quit Disc in detail risks of smoking and possible outcomes including copd, vascular/ heart disease, cancer , respiratory and sinus infections  Pt voices understanding cxr today

## 2011-07-24 NOTE — Assessment & Plan Note (Addendum)
In L side - scapular and rib area as well as lateral chest wall  Some tenderness and tightness cxr today  Trial of flexeril and heat - and update Pt will continue to stretch area  Suspect muscle strain but pleurisy also in differential (although symptoms are not pleuritic) Consider PT if needed

## 2011-07-26 ENCOUNTER — Telehealth: Payer: Self-pay | Admitting: Family Medicine

## 2011-07-26 DIAGNOSIS — R05 Cough: Secondary | ICD-10-CM

## 2011-07-26 DIAGNOSIS — R9389 Abnormal findings on diagnostic imaging of other specified body structures: Secondary | ICD-10-CM

## 2011-07-26 DIAGNOSIS — F17200 Nicotine dependence, unspecified, uncomplicated: Secondary | ICD-10-CM

## 2011-07-26 DIAGNOSIS — R059 Cough, unspecified: Secondary | ICD-10-CM

## 2011-07-26 DIAGNOSIS — R7309 Other abnormal glucose: Secondary | ICD-10-CM

## 2011-07-26 NOTE — Telephone Encounter (Signed)
She will need bun and cr - please schedule for draw- I did the order I did the order for CT

## 2011-07-26 NOTE — Telephone Encounter (Signed)
Message copied by Judy Pimple on Thu Jul 26, 2011 10:17 PM ------      Message from: Patience Musca      Created: Wed Jul 25, 2011  5:48 PM      Regarding: CT scan referral       Please see result note 07/25/11.      ----- Message -----         From: Roxy Manns, MD         Sent: 07/24/2011   8:21 PM           To: Yetta Glassman, LPN            Please let pt know cxr shows an irregular contour of the middle of the chest on L as well as pleural thickening (that could be consistent with the pleurisy possibility we discussed)       In order to see more closely - a CT scan with contrast is recommended       Please ask if she is agreeable to it -- would show the mid chest better

## 2011-07-27 ENCOUNTER — Other Ambulatory Visit (INDEPENDENT_AMBULATORY_CARE_PROVIDER_SITE_OTHER): Payer: Medicare Other | Admitting: Family Medicine

## 2011-07-27 DIAGNOSIS — R7309 Other abnormal glucose: Secondary | ICD-10-CM

## 2011-07-27 DIAGNOSIS — R918 Other nonspecific abnormal finding of lung field: Secondary | ICD-10-CM

## 2011-07-27 DIAGNOSIS — R9389 Abnormal findings on diagnostic imaging of other specified body structures: Secondary | ICD-10-CM

## 2011-07-27 NOTE — Telephone Encounter (Signed)
Spoke to pt she will come in today for labs and we will set up CT scan after lab appt.

## 2011-08-02 ENCOUNTER — Ambulatory Visit (INDEPENDENT_AMBULATORY_CARE_PROVIDER_SITE_OTHER)
Admission: RE | Admit: 2011-08-02 | Discharge: 2011-08-02 | Disposition: A | Discharge status: Home / Self Care | Payer: Medicare Other | Source: Ambulatory Visit | Attending: Family Medicine | Admitting: Family Medicine

## 2011-08-02 DIAGNOSIS — F17200 Nicotine dependence, unspecified, uncomplicated: Secondary | ICD-10-CM

## 2011-08-02 DIAGNOSIS — R9389 Abnormal findings on diagnostic imaging of other specified body structures: Secondary | ICD-10-CM

## 2011-08-02 DIAGNOSIS — R05 Cough: Secondary | ICD-10-CM

## 2011-08-02 DIAGNOSIS — R059 Cough, unspecified: Secondary | ICD-10-CM

## 2011-08-02 DIAGNOSIS — R918 Other nonspecific abnormal finding of lung field: Secondary | ICD-10-CM

## 2011-08-02 MED ORDER — IOHEXOL 300 MG/ML  SOLN
80.0000 mL | Freq: Once | INTRAMUSCULAR | Status: AC | PRN
  Administered 2011-08-02: 80 mL via INTRAVENOUS

## 2011-08-07 ENCOUNTER — Encounter: Payer: Self-pay | Admitting: Family Medicine

## 2011-08-07 ENCOUNTER — Ambulatory Visit (INDEPENDENT_AMBULATORY_CARE_PROVIDER_SITE_OTHER): Payer: Medicare Other | Admitting: Family Medicine

## 2011-08-07 VITALS — BP 150/80 | HR 92 | Temp 98.5°F | Ht 63.5 in | Wt 143.5 lb

## 2011-08-07 DIAGNOSIS — R059 Cough, unspecified: Secondary | ICD-10-CM

## 2011-08-07 DIAGNOSIS — I719 Aortic aneurysm of unspecified site, without rupture: Secondary | ICD-10-CM | POA: Insufficient documentation

## 2011-08-07 DIAGNOSIS — J449 Chronic obstructive pulmonary disease, unspecified: Secondary | ICD-10-CM

## 2011-08-07 DIAGNOSIS — R05 Cough: Secondary | ICD-10-CM

## 2011-08-07 DIAGNOSIS — E785 Hyperlipidemia, unspecified: Secondary | ICD-10-CM

## 2011-08-07 DIAGNOSIS — F17200 Nicotine dependence, unspecified, uncomplicated: Secondary | ICD-10-CM

## 2011-08-07 DIAGNOSIS — M549 Dorsalgia, unspecified: Secondary | ICD-10-CM

## 2011-08-07 NOTE — Assessment & Plan Note (Signed)
Chol is high and pt has obvious atherosclerosis of aorta -- with ulceration  Need to get this down  Intol of statins thus far May need to at least attempt qod dosing  Pt will call with last me tried

## 2011-08-07 NOTE — Progress Notes (Signed)
Subjective:    Patient ID: Ann Wu, female    DOB: 1946-11-10, 65 y.o.   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 and 2 suspected athrosclerotic ulcers (no dissection) No aneurysm in family but mom died of MI    Also small pleural eff and some emphysema  Enc last visit to start prilosec  Is still at 1/2 ppd  Plans to quit today  Will go to accupuncture for that   Pain is overall better - is reassured by that -- still just a little sore if she lies on it  Muscle relaxer really helped -- happy about that  Was spiking a low grade fever at night - and that went away   Cough is much better -- took prilosec daily for a while and that helped  Then post nasal drip became more of a problem   Patient Active Problem List  Diagnoses  . COLONIC POLYPS  . HYPERLIPIDEMIA  . HYPERKALEMIA  . TOBACCO ABUSE  . ALLERGIC RHINITIS  . COPD  . FIBROCYSTIC BREAST DISEASE  . DUPUYTREN'S CONTRACTURE  . OSTEOPENIA  . SLEEP APNEA, MILD  . HYPERGLYCEMIA  . Cough  . Back pain  . Abnormal chest x-ray  . Aneurysm of aorta   Past Medical History  Diagnosis Date  . Allergic rhinitis, seasonal     mild  . COPD (chronic obstructive pulmonary disease)     likely per CXR  . Hyperlipidemia   . Osteopenia   . Smoker   . Dupuytren's contracture of hand   . Bulging lumbar disc 10/99    L 3-4, L 4-5  . Osteopenia     dexa 2003, worse 2006, slighty worse 2010  . Gastritis     EGD 03/2005, 11/10   Past Surgical History  Procedure Date  . Cholecystectomy   . Gyn surgery 09/1999    hysterectomy- fibroids  . Cyst aspirations      x 2- breast   History  Substance Use Topics  . Smoking status: Current Everyday Smoker  . Smokeless tobacco: Not on file  . Alcohol Use: Not on file   Family History  Problem Relation Age of  Onset  . Cancer Mother     breast  . Coronary artery disease Mother    Allergies  Allergen Reactions  . Codeine     REACTION: nausea and vomiting   Current Outpatient Prescriptions on File Prior to Visit  Medication Sig Dispense Refill  . cyclobenzaprine (FLEXERIL) 10 MG tablet Take 1 tablet (10 mg total) by mouth every 8 (eight) hours as needed for muscle spasms (caution of sedation).  30 tablet  0  . ibuprofen (ADVIL,MOTRIN) 200 MG tablet Take 200 mg by mouth every 6 (six) hours as needed.        Marland Kitchen omeprazole (PRILOSEC) 20 MG capsule Take 20 mg by mouth daily as needed.        . ALPRAZolam (XANAX) 0.5 MG tablet 1 by mouth once daily as needed for severe anxiety       . celecoxib (CELEBREX) 200 MG capsule 1 by mouth once daily with food as needed       . Misc Natural Products (OSTEO BI-FLEX ADV DOUBLE ST PO) As needed       . naproxen sodium (ALEVE)  220 MG tablet Take 220 mg by mouth 2 (two) times daily with a meal.        . scopolamine (TRANSDERM-SCOP) 1.5 MG Place 1 patch onto the skin every 3 (three) days.            Review of Systems Review of Systems  Constitutional: Negative for fever, appetite change, fatigue and unexpected weight change.  Eyes: Negative for pain and visual disturbance.  Respiratory: Negative for cough and shortness of breath.   Cardiovascular: Negative.for palpitations or sob or edema    Gastrointestinal: Negative for nausea, diarrhea and constipation.  Genitourinary: Negative for urgency and frequency.  Skin: Negative for pallor. or rash  Neurological: Negative for weakness, light-headedness, numbness and headaches.  Hematological: Negative for adenopathy. Does not bruise/bleed easily.  Psychiatric/Behavioral: Negative for dysphoric mood. The patient is not nervous/anxious.          Objective:   Physical Exam  Constitutional: She appears well-developed and well-nourished.  HENT:  Head: Normocephalic and atraumatic.  Mouth/Throat: Oropharynx is  clear and moist.  Eyes: Conjunctivae and EOM are normal. Pupils are equal, round, and reactive to light. No scleral icterus.       No conj pallor   Neck: Normal range of motion. Neck supple. No JVD present. No thyromegaly present.  Cardiovascular: Normal rate, regular rhythm, normal heart sounds and intact distal pulses.        Normal femoral pulses  Pulmonary/Chest: Effort normal and breath sounds normal. No respiratory distress. She has no wheezes.       Diffusely distant bs   Abdominal: Soft. Bowel sounds are normal. She exhibits no distension and no mass. There is no tenderness.  Musculoskeletal: Normal range of motion. She exhibits no edema and no tenderness.  Lymphadenopathy:    She has no cervical adenopathy.  Neurological: She is alert. She has normal reflexes. No cranial nerve deficit. Coordination normal.  Skin: Skin is warm and dry. No rash noted. No erythema. No pallor.  Psychiatric: She has a normal mood and affect.          Assessment & Plan:

## 2011-08-07 NOTE — Assessment & Plan Note (Signed)
Reviewed chest CT with pt and suspect the small pleural eff is due to early emphysema Pt made promise to absolutely quit smoking today - is already down to 1/2 ppd  Will disc further at f/u

## 2011-08-07 NOTE — Patient Instructions (Signed)
Please call later and leave me a message with what your dose of crestor is  I will likely have you try it every other day and then plan labs in 4-6 weeks Avoid red meat/ fried foods/ egg yolks/ fatty breakfast meats/ butter, cheese and high fat dairy/ and shellfish   We will do vascular surgeon ref at check out for aneurysm  If you get chest pain - go to ER immediately  Stop smoking today !!!!!

## 2011-08-08 ENCOUNTER — Telehealth: Payer: Self-pay | Admitting: *Deleted

## 2011-08-08 NOTE — Telephone Encounter (Signed)
Pt called to report that she takes 10 mg's of crestor daily.

## 2011-08-09 ENCOUNTER — Encounter: Payer: Self-pay | Admitting: Family Medicine

## 2011-08-09 ENCOUNTER — Other Ambulatory Visit: Payer: Self-pay | Admitting: Family Medicine

## 2011-08-09 DIAGNOSIS — E7800 Pure hypercholesterolemia, unspecified: Secondary | ICD-10-CM

## 2011-08-09 DIAGNOSIS — I712 Thoracic aortic aneurysm, without rupture, unspecified: Secondary | ICD-10-CM | POA: Insufficient documentation

## 2011-08-09 MED ORDER — ROSUVASTATIN CALCIUM 10 MG PO TABS: 10.0000 mg | ORAL_TABLET | ORAL | Status: DC

## 2011-08-09 NOTE — Assessment & Plan Note (Signed)
Improved on prilosec  Will continue that  Also plans to quit smoking today

## 2011-08-09 NOTE — Telephone Encounter (Signed)
Patient notified as instructed by telephone. Medication phoned to [employer] as instructed. Fasting Lab appt scheduled as instructed 09/06/11 at 8:35.

## 2011-08-09 NOTE — Telephone Encounter (Signed)
Please tell her to take it every other day Px written for call in   sched labs in 4-6 weeks for lipid/ast/alt 272  If she experiences muscle pain or other side eff update me  Avoid red meat/ fried foods/ egg yolks/ fatty breakfast meats/ butter, cheese and high fat dairy/ and shellfish

## 2011-08-09 NOTE — Assessment & Plan Note (Signed)
Improved but not gone Unsure if related to her aortic aneurysm or more likely copd and small pleural eff  Ref done to vascular Quitting smoking today Will make further plan after eval

## 2011-08-09 NOTE — Assessment & Plan Note (Signed)
Plans to quit smoking to day in light of new aortic aneurysm and also atherosclerotic ulcers in vessel Disc dire importance of this  Plans to quit on her own today and asked her to throw out cigarettes she has

## 2011-08-10 ENCOUNTER — Encounter: Payer: Self-pay | Admitting: Vascular Surgery

## 2011-08-14 ENCOUNTER — Encounter: Payer: Medicare Other | Admitting: Vascular Surgery

## 2011-08-14 ENCOUNTER — Encounter: Payer: Self-pay | Admitting: *Deleted

## 2011-08-14 ENCOUNTER — Telehealth: Payer: Self-pay | Admitting: *Deleted

## 2011-08-14 ENCOUNTER — Ambulatory Visit (INDEPENDENT_AMBULATORY_CARE_PROVIDER_SITE_OTHER): Payer: Medicare Other | Admitting: Vascular Surgery

## 2011-08-14 ENCOUNTER — Encounter: Payer: Self-pay | Admitting: Vascular Surgery

## 2011-08-14 VITALS — BP 153/76 | HR 92 | Resp 20 | Ht 62.0 in | Wt 143.0 lb

## 2011-08-14 DIAGNOSIS — I714 Abdominal aortic aneurysm, without rupture: Secondary | ICD-10-CM

## 2011-08-14 NOTE — Telephone Encounter (Signed)
Ask her if there is another particular vascular surgeon of if she just wants me to refer her to another doctor  Can print her out a copy of the CT report , but I am unable to make her a disk-- she may have to go to the imaging center for that  Please mail her CT report and cxr report - we did review these at her visit but I don't remember if I gave her copies  Let me know about the referral

## 2011-08-14 NOTE — Telephone Encounter (Signed)
Patient called stating that she saw Dr. Arbie Cookey this morning and was told that she has an aorta aneurysm . Patient said that he was going to continue to watch this, but this really scares her. Patient wants a second opinion and wants a copy of her medical records and a CD of her CT scan.

## 2011-08-14 NOTE — Progress Notes (Signed)
The patient presents today for followup of an incidental finding of a descending thoracic aneurysm found on CT scan. She is an otherwise healthy 65 year old female who had persistent cough. She feels that she had this from a child and reports that it has subsequently resolved. She did have a chest x-ray during this period and did show some mediastinal widening. She underwent a CAT scan for followup of this and this reveals a 3.9 cm descending thoracic aortic aneurysm. I reviewed her specific CT scan films and discuss this at length with the patient. She does have some history of degenerative disc disease in her back and had some left-sided pain associated with this which seems to be musculoskeletal. She is wearing a nicotine patch and has a critical importance of smoking cessation. He did have some sore throat associated with a cough.  Past Medical History  Diagnosis Date  . Allergic rhinitis, seasonal     mild  . COPD (chronic obstructive pulmonary disease)     likely per CXR  . Hyperlipidemia   . Osteopenia   . Smoker   . Dupuytren's contracture of hand   . Bulging lumbar disc 10/99    L 3-4, L 4-5  . Osteopenia     dexa 2003, worse 2006, slighty worse 2010  . Gastritis     EGD 03/2005, 11/10  . Thoracic aortic aneurysm 8/12    History  Substance Use Topics  . Smoking status: Former Smoker -- 1.0 packs/day for 51 years    Types: Cigarettes    Quit date: 08/14/2011  . Smokeless tobacco: Not on file  . Alcohol Use: No    Family History  Problem Relation Age of Onset  . Cancer Mother     breast  . Coronary artery disease Mother     Allergies  Allergen Reactions  . Augmentin   . Codeine     REACTION: nausea and vomiting    Current outpatient prescriptions:ALPRAZolam (XANAX) 0.5 MG tablet, 1 by mouth once daily as needed for severe anxiety , Disp: , Rfl: ;  celecoxib (CELEBREX) 200 MG capsule, 1 by mouth once daily with food as needed , Disp: , Rfl: ;  cyclobenzaprine  (FLEXERIL) 10 MG tablet, Take 1 tablet (10 mg total) by mouth every 8 (eight) hours as needed for muscle spasms (caution of sedation)., Disp: 30 tablet, Rfl: 0 ibuprofen (ADVIL,MOTRIN) 200 MG tablet, Take 200 mg by mouth every 6 (six) hours as needed.  , Disp: , Rfl: ;  naproxen sodium (ALEVE) 220 MG tablet, Take 220 mg by mouth 2 (two) times daily with a meal.  , Disp: , Rfl: ;  omeprazole (PRILOSEC) 20 MG capsule, Take 20 mg by mouth daily as needed.  , Disp: , Rfl: ;  rosuvastatin (CRESTOR) 10 MG tablet, Take 1 tablet (10 mg total) by mouth every other day., Disp: 15 tablet, Rfl: 11 Misc Natural Products (OSTEO BI-FLEX ADV DOUBLE ST PO), As needed , Disp: , Rfl: ;  scopolamine (TRANSDERM-SCOP) 1.5 MG, Place 1 patch onto the skin every 3 (three) days.  , Disp: , Rfl:   BP 153/76  Pulse 92  Resp 20  Ht 5\' 2"  (1.575 m)  Wt 143 lb (64.864 kg)  BMI 26.15 kg/m2  Body mass index is 26.15 kg/(m^2).        Review of systems: Gastroesophageal reflux, occasional dizziness, low back discomfort, otherwise negative  Physical exam: Well-developed well-nourished white female appearing stated age area HEENT normal. Chest clear bilaterally without  rales rhonchi or wheezing. Heart regular rate and rhythm. Carotid arteries without bruits bilaterally. 2+ radial femoral and dorsalis pedis pulses bilaterally. Musculoskeletal no major deformities. Neurologic no focal weakness. Skin without ulcers or rashes.  Impression: Incidental finding of a 3.9 cm descending thoracic aneurysm. The artery is irregular throughout the aneurysmal portion and also does have some irregularity in the aorta distal to this. I discussed the critical importance of smoking cessation. We will see her in 6 months with repeat CAT scan of her chest abdomen and pelvis for further evaluation. I explained symptoms of leaking aneurysm and also of distal emboli from the aneurysm. She'll notify should this occur.  Plan: 6 months a CT followup of  descending thoracic aneurysm

## 2011-08-15 ENCOUNTER — Other Ambulatory Visit: Payer: Self-pay | Admitting: Vascular Surgery

## 2011-08-15 DIAGNOSIS — I714 Abdominal aortic aneurysm, without rupture: Secondary | ICD-10-CM

## 2011-08-15 NOTE — Telephone Encounter (Signed)
Thanks - let me know if she also needs a formal referral or if she does not

## 2011-08-15 NOTE — Telephone Encounter (Signed)
Patient notified as instructed by telephone. Pt said she had spoken with Vicky at Dr Kizzie Bane and should not need a formal referral.

## 2011-08-15 NOTE — Telephone Encounter (Signed)
Spoke with pt.  She has talked with Dr. Kizzie Bane office at [facility], they told her to send the requested reports and they will review and then call her with appt time.  She is going to pick up CD tomorrow, copies of CT and x-ray reports placed up front for her to pick up.

## 2011-08-23 NOTE — Telephone Encounter (Signed)
Great, thanks for the update - I will look forward to a report

## 2011-09-06 ENCOUNTER — Other Ambulatory Visit: Payer: Medicare Other

## 2011-09-07 ENCOUNTER — Ambulatory Visit (INDEPENDENT_AMBULATORY_CARE_PROVIDER_SITE_OTHER): Payer: Medicare Other | Admitting: Family Medicine

## 2011-09-07 ENCOUNTER — Encounter: Payer: Self-pay | Admitting: Family Medicine

## 2011-09-07 VITALS — BP 138/62 | HR 88 | Temp 98.4°F | Ht 62.0 in | Wt 151.5 lb

## 2011-09-07 DIAGNOSIS — R7309 Other abnormal glucose: Secondary | ICD-10-CM

## 2011-09-07 DIAGNOSIS — Z1231 Encounter for screening mammogram for malignant neoplasm of breast: Secondary | ICD-10-CM

## 2011-09-07 DIAGNOSIS — I10 Essential (primary) hypertension: Secondary | ICD-10-CM

## 2011-09-07 DIAGNOSIS — I719 Aortic aneurysm of unspecified site, without rupture: Secondary | ICD-10-CM

## 2011-09-07 DIAGNOSIS — Z87891 Personal history of nicotine dependence: Secondary | ICD-10-CM

## 2011-09-07 DIAGNOSIS — E785 Hyperlipidemia, unspecified: Secondary | ICD-10-CM

## 2011-09-07 HISTORY — DX: Essential (primary) hypertension: I10

## 2011-09-07 LAB — HEMOGLOBIN A1C: Hgb A1c MFr Bld: 6 % (ref 4.6–6.5)

## 2011-09-07 LAB — COMPREHENSIVE METABOLIC PANEL
ALT: 134 U/L — ABNORMAL HIGH (ref 0–35)
AST: 44 U/L — ABNORMAL HIGH (ref 0–37)
Albumin: 4 g/dL (ref 3.5–5.2)
Alkaline Phosphatase: 104 U/L (ref 39–117)
BUN: 15 mg/dL (ref 6–23)
Calcium: 9.8 mg/dL (ref 8.4–10.5)
Chloride: 107 mEq/L (ref 96–112)
Potassium: 5.4 mEq/L — ABNORMAL HIGH (ref 3.5–5.1)
Total Protein: 7.4 g/dL (ref 6.0–8.3)

## 2011-09-07 LAB — LIPID PANEL
LDL Cholesterol: 102 mg/dL — ABNORMAL HIGH (ref 0–99)
Total CHOL/HDL Ratio: 3
Triglycerides: 73 mg/dL (ref 0.0–149.0)

## 2011-09-07 MED ORDER — OMEPRAZOLE 20 MG PO CPDR: 20.0000 mg | DELAYED_RELEASE_CAPSULE | Freq: Every day | ORAL | Status: DC | PRN

## 2011-09-07 NOTE — Assessment & Plan Note (Signed)
On crestor 10 every other day with some aches  Overall tolerating Needs good chol control Lab today and adv

## 2011-09-07 NOTE — Assessment & Plan Note (Signed)
a1c today Some wt gain Disc diet in detail

## 2011-09-07 NOTE — Assessment & Plan Note (Signed)
For f/u at [facility] in 6 mo  Quit smoking  On norvasc

## 2011-09-07 NOTE — Patient Instructions (Addendum)
Stop any anti inflammatories- aleve/ advil/ motrin /celebrex Tylenol is ok  Lab today  Work on [employer] and exercise  [employer] job with the smoking so far We will schedule mammogram at check out  Follow up with me in 1 month Keep watching bp  We will add a second bp drug if needed

## 2011-09-07 NOTE — Progress Notes (Signed)
Subjective:    Patient ID: Ann Wu, female    DOB: 1946/07/26, 65 y.o.   MRN: [id number]  HPI Here to disc bp and also plan for aortic aneurysm Has sen Dr Kizzie Bane - vasc surg at [facility] This was for a 2nd opinion -- had a CT of entire aorta  Also echocardiogram with picture of aneurysm  Saw ulcers on the aorta  Same advice as Dr Arbie Cookey -- and will re check 6 months -- no surgery at this time   He was concerned about her bp - on amlodipine and took bp at home frequently  126/60, - then had an episode of vomiting (no pills that day)  Then started back  130/60 , 140-145-60-68 generally   Took aleve yesterday - doing some work at [employer] No other [employer] is up 8 lb bmi is 27   Smoking - a few cig after the last visit and then started using the patches  Was difficult, but she did it  occ puts a cig in her mouth --but does not light it  Has had a lot of anxiety from nicotine withdrawal  Had to take a xanax one day  Now has mints and gum in her purse   Is on crestor every other day also - doing ok with that as well  Has to take it - does have some joint pain   Still having problem with pain in the side - neither surgeon thinks it is related  Hurts to take a deep breath - goes up and down her side  Is staying the same (not as bad as the beginning though)   Hot flashes worse last 3 weeks   Patient Active Problem List  Diagnoses  . COLONIC POLYPS  . HYPERLIPIDEMIA  . HYPERKALEMIA  . TOBACCO ABUSE  . ALLERGIC RHINITIS  . COPD  . FIBROCYSTIC BREAST DISEASE  . DUPUYTREN'S CONTRACTURE  . OSTEOPENIA  . SLEEP APNEA, MILD  . HYPERGLYCEMIA  . Cough  . Back pain  . Abnormal chest x-ray  . Aneurysm of aorta  . Thoracic aortic aneurysm  . Hypertension  . Other screening mammogram   Past Medical History  Diagnosis Date  . Allergic rhinitis, seasonal     mild  . COPD (chronic obstructive pulmonary disease)     likely per CXR  . Hyperlipidemia   . Osteopenia   .  Smoker   . Dupuytren's contracture of hand   . Bulging lumbar disc 10/99    L 3-4, L 4-5  . Osteopenia     dexa 2003, worse 2006, slighty worse 2010  . Gastritis     EGD 03/2005, 11/10  . Thoracic aortic aneurysm 8/12  . Hypertension 09/07/2011   Past Surgical History  Procedure Date  . Cholecystectomy   . Gyn surgery 09/1999    hysterectomy- fibroids  . Cyst aspirations      x 2- breast   History  Substance Use Topics  . Smoking status: Former Smoker -- 1.0 packs/day for 51 years    Types: Cigarettes    Quit date: 08/14/2011  . Smokeless tobacco: Not on file  . Alcohol Use: No   Family History  Problem Relation Age of Onset  . Cancer Mother     breast  . Coronary artery disease Mother    Allergies  Allergen Reactions  . Augmentin   . Codeine     REACTION: nausea and vomiting   Current Outpatient Prescriptions on  File Prior to Visit  Medication Sig Dispense Refill  . ALPRAZolam (XANAX) 0.5 MG tablet 1 by mouth once daily as needed for severe anxiety       . rosuvastatin (CRESTOR) 10 MG tablet Take 1 tablet (10 mg total) by mouth every other day.  15 tablet  11  . cyclobenzaprine (FLEXERIL) 10 MG tablet Take 1 tablet (10 mg total) by mouth every 8 (eight) hours as needed for muscle spasms (caution of sedation).  30 tablet  0  . Misc Natural Products (OSTEO BI-FLEX ADV DOUBLE ST PO) As needed       . scopolamine (TRANSDERM-SCOP) 1.5 MG Place 1 patch onto the skin every 3 (three) days.            Review of Systems Review of Systems  Constitutional: Negative for fever, appetite change, fatigue and unexpected weight change.  Eyes: Negative for pain and visual disturbance.  Respiratory: Negative for cough and shortness of breath.   Cardiovascular: Negative for  palpitations  , pos for chest wall/ side pain-this still persists   Gastrointestinal: Negative for nausea, diarrhea and constipation.  Genitourinary: Negative for urgency and frequency.  Skin: Negative for  pallor or rash   Neurological: Negative for weakness, light-headedness, numbness and headaches.  Hematological: Negative for adenopathy. Does not bruise/bleed easily.  Psychiatric/Behavioral: Negative for dysphoric mood. The patient is a bit anxious about recent health problems and nicotine withdrawl        Objective:   Physical Exam  Constitutional: She appears well-developed and well-nourished. No distress.       overwt and well appearing   HENT:  Head: Normocephalic and atraumatic.  Mouth/Throat: Oropharynx is clear and moist.  Eyes: Conjunctivae and EOM are normal. Pupils are equal, round, and reactive to light.  Neck: Normal range of motion. Neck supple. No JVD present. Carotid bruit is not present. No thyromegaly present.  Cardiovascular: Normal rate, regular rhythm, normal heart sounds and intact distal pulses.   Pulmonary/Chest: Effort normal and breath sounds normal. No respiratory distress. She has no wheezes.       Diffusely distant bs , no wheeze  Abdominal: Soft. Bowel sounds are normal. She exhibits no distension. There is no tenderness.  Musculoskeletal: Normal range of motion. She exhibits no edema and no tenderness.  Lymphadenopathy:    She has no cervical adenopathy.  Neurological: She is alert. She has normal reflexes. No cranial nerve deficit. Coordination normal.       Mild hand tremor from nicotine withdrawl  Skin: Skin is warm and dry. No rash noted. No erythema. No pallor.  Psychiatric: She has a normal mood and affect.          Assessment & Plan:

## 2011-09-07 NOTE — Assessment & Plan Note (Signed)
Schedule today  Still having side pain - ? etiol Does breast exams

## 2011-09-07 NOTE — Assessment & Plan Note (Signed)
Improved with norvasc  F/u 1 mo  If not at goal may add ace

## 2011-09-08 NOTE — Assessment & Plan Note (Signed)
Pt is struggling but getting by with patches, some wt loss  Commended on this

## 2011-09-10 ENCOUNTER — Telehealth: Payer: Self-pay | Admitting: *Deleted

## 2011-09-10 DIAGNOSIS — R0789 Other chest pain: Secondary | ICD-10-CM | POA: Insufficient documentation

## 2011-09-10 DIAGNOSIS — Z87891 Personal history of nicotine dependence: Secondary | ICD-10-CM

## 2011-09-10 NOTE — Telephone Encounter (Signed)
Patient called stating that she is still having left side pain and would like to go ahead and get a referral to pulmonary.

## 2011-09-10 NOTE — Telephone Encounter (Signed)
I will do pulm ref

## 2011-09-17 ENCOUNTER — Encounter: Payer: Self-pay | Admitting: Pulmonary Disease

## 2011-09-17 ENCOUNTER — Ambulatory Visit (INDEPENDENT_AMBULATORY_CARE_PROVIDER_SITE_OTHER): Payer: Medicare Other | Admitting: Pulmonary Disease

## 2011-09-17 DIAGNOSIS — Z720 Tobacco use: Secondary | ICD-10-CM

## 2011-09-17 DIAGNOSIS — F17200 Nicotine dependence, unspecified, uncomplicated: Secondary | ICD-10-CM

## 2011-09-17 DIAGNOSIS — R091 Pleurisy: Secondary | ICD-10-CM

## 2011-09-17 DIAGNOSIS — J90 Pleural effusion, not elsewhere classified: Secondary | ICD-10-CM

## 2011-09-17 NOTE — Progress Notes (Signed)
Subjective:    Patient ID: Ann Wu, female    DOB: 1946/10/06, 65 y.o.   MRN: [id number]  HPI 65 y/o female with the above listed past medical history presents for evaluation of two months of left sided pleuritic chest pain.  She states that the symptoms started insidiously and have not subsided despite two months duration.  The pain is sharp, located to her left mid-axillary line, and is usually brought on by a deep breath.  She also notes cramping in her left subscapular area which is distinct from the left mid-axillary pain.  The pain is also worse while lying on that side.  She had no preceding injury, respiratory infection, or change in medications.  She notes no cough now and only has dyspnea if she walks a long distance at a fast pace.  As part of the work up for this pain, she underwent a CXR which showed a small left sided pleural effusion followed by a CT scan showing the same and a descending aortic aneurysm.  She has seen vascular surgery at [facility] and [facility] and both suggested observation.  She says that the surgeons do not think that the aneurysm is causing the pain.   On 08/14/2011 she quit smoking and has abstained with the help of a nicotine patch since.  Prior to quitting she had a chronic dry cough but this has subsided in the last few weeks.  She has gained 9 lbs since quitting smoking but prior to this her weight had been stable.  No recent fevers.  She denies any asbestos exposure either direct or indirect, though she does not know if her house has any asbestos materials.    Review of Systems  Constitutional: Negative for fever, chills and unexpected weight change.  HENT: Negative for ear pain, nosebleeds, congestion, sore throat, rhinorrhea, sneezing, trouble swallowing, dental problem, voice change, postnasal drip and sinus pressure.   Eyes: Negative for visual disturbance.  Respiratory: Positive for cough. Negative for choking and shortness of breath.   Cardiovascular:  Negative for chest pain and leg swelling.  Gastrointestinal: Negative for vomiting, abdominal pain and diarrhea.  Genitourinary: Negative for difficulty urinating.  Musculoskeletal: Negative for arthralgias.  Skin: Negative for rash.  Neurological: Negative for tremors, syncope and headaches.  Hematological: Does not bruise/bleed easily.       Objective:   Physical Exam  Gen: well appearing, no acute distress HEENT: NCAT, PERRL, EOMi, OP clear, neck supple without masses PULM: CTA B, slightly dull to percussion left base GEN: RRR, no mgr, no JVD AB: BS+, soft, nontender, no hsm Ext: warm, no edema, no clubbing, no cyanosis Derm: no rash or skin breakdown Neuro: A&Ox4, CN II-XII intact, strength 5/5 in all 4 extremeties        Assessment & Plan:  Impression: 1) Pleurisy 2) Unilateral pleural effusion 3) Thoracic aortic aneurysm 4) Tobacco abuse: now quit  A&P: 65 y/o female smoker (now quit x 1 month) presents for evaluation of two months of pleuritic chest pain and pleural effusion.  Her symptoms have not resolved and on exam today her exam is suggestive of a small effusion.  DDx of a unilateral sided effusion in an otherwise health female is limited without pleural fluid analysis.  Initial thoughts would include viral pleuritis, serositis from an inflammatory / connective tissue disease, pulmonary embolism, or malignancy.  At this point I would like to repeat her CXR to see if the effusion has resolved.  I have reviewed the  prior images myself and agree that a small effusion is present but am also concerned that there is pleural thickening.    1) Pleuritic pain:  -agree with prn tylenol and ocassional NSAIDS (her PCP has asked that she not use regular  NSAIDS)   2) Pleural effusion:  -repeat CXR in AM  -if effusion still present, will send for ultrasound guided thoracentesis with the following labs:  pH, glucose, LDH, total protein, gm stain, culture (bacterial, afb, fungal),  cytology  -if exudative and no clear etiology, consider repeat CT angio to evaluate for pulmonary   embolism and re-evaluate pleural thickening  3) Tobacco abuse:  -encouraged at length today to continue to abstain from cigarettes.  4) Will schedule next ROV after CXR and thoracentesis

## 2011-09-17 NOTE — Patient Instructions (Signed)
Continue to take tylenol and ultram for pleuritic pain You will have a chest x-ray tomorrow and we will call you with the results and will schedule a follow up appointment then. Continue to abstain from cigarrettes.

## 2011-09-18 ENCOUNTER — Telehealth: Payer: Self-pay | Admitting: Family Medicine

## 2011-09-18 ENCOUNTER — Telehealth: Payer: Self-pay | Admitting: Pulmonary Disease

## 2011-09-18 ENCOUNTER — Telehealth: Payer: Self-pay

## 2011-09-18 NOTE — Telephone Encounter (Signed)
I am sorry I forgot to leave pt's contact  #s [phone] or [phone].

## 2011-09-18 NOTE — Telephone Encounter (Signed)
Pt wanted Dr Milinda Antis to know that she saw Dr Ebony Hail on 09/17/11 with a 4:30pm appt. Pt said at 5;30pm Dr Ebony Hail went out of pt room because pt needed another xray scheduled. At 6:23pm pt heard noise outside the exam room and it was the cleaning crew. Pt said there was no one in the office except the cleaning crew and she had been left in the exam room. Pt said she wants referral to [facility] to see pulmonologist. Pt asked to be transferred to speak with Shirlee Limerick also. Pt wanted Dr Milinda Antis to be aware of her experience with [facility] new pulmonologist. Pt has already spoken with Carollee Herter at the [facility] office. Pt can be reached at [phone] or [phone].

## 2011-09-18 NOTE — Telephone Encounter (Signed)
Dr Milinda Antis asked me to send this note also to Clarisa Schools, office manager so she can contact pt also.

## 2011-09-18 NOTE — Telephone Encounter (Signed)
Called and spoke with pt this morning and apologized for the incident that occurred at our [facility] 09/17/11. Pt requests to speak directly with Dr Kendrick Fries. I advised will have him contact her and she verbalized understanding.

## 2011-09-18 NOTE — Telephone Encounter (Signed)
Tammy from [facility] called to apologize for what happened yesterday with Ann Wu. Tammy said their office is speaking with pt to try and improve relationship with pt. Tammy also apologized for the call we received from pt due to the situation at pulmonology's office. Tammy said it was the first day opening and Dr Ebony Hail did what he was supposed to do. Tammy said the staff left after 6:oopm and was not aware the pt was still in the room. Tammy said this will never happen again. I told Tammy I would let Dr Milinda Antis know.

## 2011-09-18 NOTE — Telephone Encounter (Signed)
Patient notified as instructed by telephone. Pt said that she has spoken with Director of Pulmonology and Verlon Au the nurse who were both very apologetic. Pt wants to speak with Dr Milinda Antis. Dr Kendrick Fries was going to prescribe an anti inflammatory yesterday until pt told him that Dr Milinda Antis had discontinued anti inflammatory. Pt wonders for short term if it would be OK to take anti inflammatory. Dr Kendrick Fries is to call pt this afternoon. Pt wants to talk with Dr Milinda Antis and I explained Dr Milinda Antis was with pt's and was not sure when Dr Milinda Antis could call her and pt said that was OK but she did want to speak with Dr Milinda Antis.

## 2011-09-18 NOTE — Telephone Encounter (Signed)
Will notify [name] and speak with management at [facility] office for follow up.

## 2011-09-18 NOTE — Telephone Encounter (Signed)
Thanks for the heads up - I am aware of the situation and apology was made - just want to make sure this never happens again

## 2011-09-18 NOTE — Telephone Encounter (Signed)
Patient called in this AM states she was left in the building after all staff except the cleaning crew was gone.  She was here seeing Dr. Kendrick Fries, she stated "she was scheduled an appointment at 4:30 she wasn't called back until a little before 5:00, which she wasn't mad about.  She was told by Dr. Henrene Pastor that they were going to schedule another xray to compare with her previous one.  He left the room told her to seat right there and they would be back in, so she did. She sat in the room until 6:23 when she didn't hear anyone talking other than the cleaning crew.  She did state she heard someone say put it on my desk and I will do that in morning she thought maybe they were talking about her xray.  She also asked where was her chart or paperwork that someone should have gotten for her discharge instructions.  I told her that her chart was in the computer.  She said she startled the cleaning crew and they let her out the door.  She is requesting her $81 co-pay back so she can use it with a Risk manager.  She also requested the number that Dr. Kendrick Fries was at today, I told her I didn't know where he was but she could call the [facility] office.  She also asked who was in charge to make sure the patient's were out of the room."  I told her the CMA's normally did that, but from now on I would personally go and check the rooms.  I did apologize several times. She wanted to know how this would be handled I told her I was taking a phone note and also contacting IT consultant.  Patient is going to call the [place] office she said she is going to talk with Dr. Kendrick Fries today.  I did call my manager and she said we will discuss when she gets here.

## 2011-09-18 NOTE — Telephone Encounter (Signed)
Thanks - I spoke to her - she is upset and will not return to [facility]- plans to go to [facility] I told her that occ nsaid for bad pain is ok- but not for days in a row as we are watching the bp and bleeding risk She will try a dose of aleve this week and her appt at [facility] is being scheduled

## 2011-09-19 ENCOUNTER — Telehealth: Payer: Self-pay | Admitting: Family Medicine

## 2011-09-19 DIAGNOSIS — R0789 Other chest pain: Secondary | ICD-10-CM

## 2011-09-19 NOTE — Telephone Encounter (Signed)
Patient called to say that the Director of Pulmonary called her yesterday to apologize for leaving her in the room at the office visit with Dr Kendrick Fries. Also Dr Kendrick Fries called her himself to also apologize. She also received flowers yesterday which she appreciated. Dr Henrene Pastor gave her three Drs names  From [facility] and he also sent them an email about her condition. Patient asked if you would do this referral to [facility], Pulmonary. Please pur referral in for her and I can take care of making this appt and send all of her records to them.

## 2011-09-19 NOTE — Telephone Encounter (Signed)
I will do the referral.

## 2011-09-20 ENCOUNTER — Other Ambulatory Visit: Payer: Self-pay

## 2011-09-20 ENCOUNTER — Other Ambulatory Visit (INDEPENDENT_AMBULATORY_CARE_PROVIDER_SITE_OTHER): Payer: Medicare Other

## 2011-09-20 DIAGNOSIS — R6889 Other general symptoms and signs: Secondary | ICD-10-CM

## 2011-09-20 DIAGNOSIS — R899 Unspecified abnormal finding in specimens from other organs, systems and tissues: Secondary | ICD-10-CM

## 2011-09-20 LAB — HEPATIC FUNCTION PANEL
Bilirubin, Direct: 0 mg/dL (ref 0.0–0.3)
Total Bilirubin: 0.5 mg/dL (ref 0.3–1.2)
Total Protein: 7.9 g/dL (ref 6.0–8.3)

## 2011-09-20 LAB — POTASSIUM: Potassium: 4.5 mEq/L (ref 3.5–5.1)

## 2011-09-20 MED ORDER — AMLODIPINE BESYLATE 10 MG PO TABS: 10.0000 mg | ORAL_TABLET | Freq: Every day | ORAL | Status: DC

## 2011-09-20 NOTE — Telephone Encounter (Signed)
Patient notified as instructed by telephone. 

## 2011-09-20 NOTE — Telephone Encounter (Signed)
Given those home bp - lets stay with what she is on until f/u Will refill electronically

## 2011-09-20 NOTE — Telephone Encounter (Signed)
Pt is presently taking Amlodipine Besylate 10 mg taking one daily that was prescribed by Dr Kizzie Bane at [facility]. Pt said Dr Kizzie Bane wanted PCP to follow BP. Pt seen by Dr Milinda Antis 09/07/11 and to f/u 1 mth (pt has appt already scheduled 10/09/11) and if BP not at goal Dr Milinda Antis said may add ace. Pt wonders if should increase Amlodipine or change med now. Pt said her BP is averaging 138-140 over 65-70. Pt said she would do whatever Dr Milinda Antis suggest. Pt needs refill on Amlodipine 10 mg if she is to stay on med and has enough med to last til Mon. Pt uses [facility] and pt can be reached at [phone].Please advise.

## 2011-09-25 NOTE — Telephone Encounter (Signed)
Levin Erp and myself all spoke with this pt and gave apology for this incident--further documentation on another phone note

## 2011-10-09 ENCOUNTER — Encounter: Payer: Self-pay | Admitting: Family Medicine

## 2011-10-09 ENCOUNTER — Ambulatory Visit (INDEPENDENT_AMBULATORY_CARE_PROVIDER_SITE_OTHER): Payer: Medicare Other | Admitting: Family Medicine

## 2011-10-09 DIAGNOSIS — F418 Other specified anxiety disorders: Secondary | ICD-10-CM

## 2011-10-09 DIAGNOSIS — R7309 Other abnormal glucose: Secondary | ICD-10-CM

## 2011-10-09 DIAGNOSIS — R0789 Other chest pain: Secondary | ICD-10-CM

## 2011-10-09 DIAGNOSIS — I10 Essential (primary) hypertension: Secondary | ICD-10-CM

## 2011-10-09 DIAGNOSIS — E785 Hyperlipidemia, unspecified: Secondary | ICD-10-CM

## 2011-10-09 DIAGNOSIS — F341 Dysthymic disorder: Secondary | ICD-10-CM

## 2011-10-09 DIAGNOSIS — R071 Chest pain on breathing: Secondary | ICD-10-CM

## 2011-10-09 DIAGNOSIS — Z87891 Personal history of nicotine dependence: Secondary | ICD-10-CM

## 2011-10-09 MED ORDER — SERTRALINE HCL 25 MG PO TABS: 25.0000 mg | ORAL_TABLET | Freq: Every day | ORAL | Status: DC

## 2011-10-09 MED ORDER — COLESEVELAM HCL 625 MG PO TABS: ORAL_TABLET | ORAL | Status: DC

## 2011-10-09 MED ORDER — LISINOPRIL 5 MG PO TABS: 5.0000 mg | ORAL_TABLET | Freq: Every day | ORAL | Status: DC

## 2011-10-09 NOTE — Progress Notes (Signed)
Subjective:    Patient ID: Ann Wu, female    DOB: Jan 01, 1946, 65 y.o.   MRN: [id number]  HPI Pt is here for f/u of HTN on norvasc and also hyperlipidemia / hyperglycemia  Generally does not feel good - still hurts in her L side  Saw Dr at [facility]- told to take 800 mg of ibup for 5 d- no difference She will give it another week- and then call him back  ? If pleurisy -- but the fluid is gone from the first CT- but still inflammed -- hurts sometimes to take deep breath  Tramadol not helpful  Pain all the time  Really liked Dr At [facility]  Was on crestor and that- like other statins - inc LFTs Stopped it  ? If interested in welchol- pt said she would do some research on it   Wt is stable - down 2 lb with bmi of 27  Smoking- took off her last patch yesterday -- is happy but unhappy about 10 lb wt gain  Chews straws and occ nicorette gum  Is harder now than it was 3 weeks ago   a1c was 6.0  bp at home averages about 140/65  Today 138/68 No side eff from norvasc - is doing ok   Is very irritable - and this bothers her  Xanax prn Interested in daily med  Some depression and anxiety  No SI   Patient Active Problem List  Diagnoses  . COLONIC POLYPS  . HYPERLIPIDEMIA  . HYPERKALEMIA  . Former smoker  . ALLERGIC RHINITIS  . COPD  . FIBROCYSTIC BREAST DISEASE  . DUPUYTREN'S CONTRACTURE  . OSTEOPENIA  . SLEEP APNEA, MILD  . HYPERGLYCEMIA  . Back pain  . Abnormal chest x-ray  . Aneurysm of aorta  . Thoracic aortic aneurysm  . Hypertension  . Other screening mammogram  . Left-sided chest wall pain  . Depression with anxiety   Past Medical History  Diagnosis Date  . Allergic rhinitis, seasonal     mild  . COPD (chronic obstructive pulmonary disease)     likely per CXR  . Hyperlipidemia   . Osteopenia   . Smoker   . Dupuytren's contracture of hand   . Bulging lumbar disc 10/99    L 3-4, L 4-5  . Osteopenia     dexa 2003, worse 2006, slighty worse 2010  .  Gastritis     EGD 03/2005, 11/10  . Thoracic aortic aneurysm 8/12  . Hypertension 09/07/2011   Past Surgical History  Procedure Date  . Cholecystectomy   . Gyn surgery 09/1999    hysterectomy- fibroids  . Cyst aspirations      x 2- breast   History  Substance Use Topics  . Smoking status: Former Smoker -- 1.0 packs/day for 51 years    Types: Cigarettes    Quit date: 08/14/2011  . Smokeless tobacco: Never Used  . Alcohol Use: No   Family History  Problem Relation Age of Onset  . Cancer Mother     breast  . Coronary artery disease Mother   . Stomach cancer Father   . Heart attack Mother    Allergies  Allergen Reactions  . Augmentin   . Codeine     REACTION: nausea and vomiting   Current Outpatient Prescriptions on File Prior to Visit  Medication Sig Dispense Refill  . acetaminophen (TYLENOL) 325 MG tablet Take 650 mg by mouth every 6 (six) hours as needed.        Marland Kitchen  amLODipine (NORVASC) 10 MG tablet Take 1 tablet (10 mg total) by mouth daily.  30 tablet  11  . omeprazole (PRILOSEC) 20 MG capsule Take 1 capsule (20 mg total) by mouth daily as needed.  30 capsule  11  . traMADol (ULTRAM-ER) 100 MG 24 hr tablet Take 100 mg by mouth daily as needed.        . ALPRAZolam (XANAX) 0.5 MG tablet 1 by mouth once daily as needed for severe anxiety       . cyclobenzaprine (FLEXERIL) 10 MG tablet Take 1 tablet (10 mg total) by mouth every 8 (eight) hours as needed for muscle spasms (caution of sedation).  30 tablet  0  . Misc Natural Products (OSTEO BI-FLEX ADV DOUBLE ST PO) As needed       . nicotine (NICODERM CQ - DOSED IN MG/24 HOURS) 14 mg/24hr patch Place 1 patch onto the skin daily.              Review of Systems Review of Systems  Constitutional: Negative for fever, appetite change, fatigue and unexpected weight change.  Eyes: Negative for pain and visual disturbance.  Respiratory: Negative for cough and shortness of breath.  pos for ongoing chest wall pain Cardiovascular:  Negative for cp or palpitations    Gastrointestinal: Negative for nausea, diarrhea and constipation.  Genitourinary: Negative for urgency and frequency.  Skin: Negative for pallor or rash   Neurological: Negative for weakness, light-headedness, numbness and headaches.  Hematological: Negative for adenopathy. Does not bruise/bleed easily.  Psychiatric/Behavioral:pos for irritability/anxiety and perhaps depression - no SI         Objective:   Physical Exam  Constitutional: She appears well-developed and well-nourished. No distress.  HENT:  Head: Normocephalic and atraumatic.  Mouth/Throat: Oropharynx is clear and moist.  Eyes: Conjunctivae and EOM are normal. Pupils are equal, round, and reactive to light.  Neck: Normal range of motion. Neck supple. No JVD present. Carotid bruit is not present. No thyromegaly present.  Cardiovascular: Normal rate, regular rhythm, normal heart sounds and intact distal pulses.  Exam reveals no gallop.   Pulmonary/Chest: Effort normal and breath sounds normal. No respiratory distress. She has no wheezes. She exhibits tenderness.  Abdominal: Soft. Bowel sounds are normal. She exhibits no distension, no abdominal bruit and no mass. There is no tenderness. There is no rebound and no guarding.  Musculoskeletal: Normal range of motion. She exhibits no edema and no tenderness.  Lymphadenopathy:    She has no cervical adenopathy.  Neurological: She is alert. She has normal reflexes. Coordination normal.  Skin: Skin is warm and dry. No rash noted. No erythema. No pallor.  Psychiatric:       Pt is very unhappy and irritable today Not tearful Eye contact fair  Good comm skills           Assessment & Plan:

## 2011-10-09 NOTE — Patient Instructions (Addendum)
I want to get bp a little lower - so continue current medicines and add lisinopril 5 mg daily  If cough or any side effect- stop it and call  For cholesterol try the welchol - if side effects stop it and call  Call your pulmonologist about the low grade fever  Start zoloft and let me know if side effects- stop it immediately if depression/worse anxiety or suicidal thoughts  Try to get exercise if you can  Follow up in 1 month

## 2011-10-11 NOTE — Assessment & Plan Note (Signed)
Ongoing- and pt has seen pulmonary Suspect pleurisy and it will take time to feel better Analgesics/ heat prn and update

## 2011-10-11 NOTE — Assessment & Plan Note (Signed)
Lots of stress and change and also recent smoking cessation (I do believe pt is bitter about having to quit smoking also) Declines counseling Will try zoloft and follow closely Rev poss side eff incl worse symptoms- will update

## 2011-10-11 NOTE — Assessment & Plan Note (Signed)
Lab Results  Component Value Date   HGBA1C 6.0 09/07/2011   rev low glycemic diet and need for exercise Will continue to follow

## 2011-10-11 NOTE — Assessment & Plan Note (Signed)
Pt intol of statins entirely - but has atherosclerosis in her aorta at site of the aneurysm -and needs to be treated Will try welchol with f/u and labs in about a month

## 2011-10-11 NOTE — Assessment & Plan Note (Signed)
Pt is irritable after quitting but I urged her to be very proud of herself and to keep up the good work

## 2011-10-11 NOTE — Assessment & Plan Note (Signed)
bp not optimal  Disc need for good control in light of AAA Rev health habits Add low dose lisinopril with close f/u Will call if side eff

## 2011-10-12 ENCOUNTER — Telehealth: Payer: Self-pay | Admitting: Pulmonary Disease

## 2011-10-12 NOTE — Telephone Encounter (Signed)
Pt called the [facility] clinic today and spoke with Ann Wu. Per Carollee Herter, pt is requesting a refund on the copay from her visit with [place] on 09/17/11. Will forward this msg to [place] to handle. Please advise, thanks!

## 2012-02-05 ENCOUNTER — Ambulatory Visit: Payer: Medicare Other | Admitting: Vascular Surgery

## 2012-02-05 ENCOUNTER — Other Ambulatory Visit: Payer: Medicare Other

## 2012-02-12 ENCOUNTER — Ambulatory Visit: Payer: Medicare Other | Admitting: Vascular Surgery

## 2012-02-12 ENCOUNTER — Other Ambulatory Visit: Payer: Medicare Other

## 2012-07-25 ENCOUNTER — Encounter: Payer: Self-pay | Admitting: Family Medicine

## 2012-07-25 ENCOUNTER — Ambulatory Visit (INDEPENDENT_AMBULATORY_CARE_PROVIDER_SITE_OTHER): Payer: Medicare Other | Admitting: Family Medicine

## 2012-07-25 VITALS — BP 128/74 | HR 86 | Temp 97.8°F | Ht 62.0 in | Wt 165.2 lb

## 2012-07-25 DIAGNOSIS — M949 Disorder of cartilage, unspecified: Secondary | ICD-10-CM

## 2012-07-25 DIAGNOSIS — R7309 Other abnormal glucose: Secondary | ICD-10-CM

## 2012-07-25 DIAGNOSIS — M899 Disorder of bone, unspecified: Secondary | ICD-10-CM

## 2012-07-25 DIAGNOSIS — I10 Essential (primary) hypertension: Secondary | ICD-10-CM

## 2012-07-25 DIAGNOSIS — E785 Hyperlipidemia, unspecified: Secondary | ICD-10-CM

## 2012-07-25 LAB — LIPID PANEL
Cholesterol: 226 mg/dL — ABNORMAL HIGH (ref 0–200)
HDL: 47.4 mg/dL (ref >39.00)
Total CHOL/HDL Ratio: 5
VLDL: 23.2 mg/dL (ref 0.0–40.0)

## 2012-07-25 LAB — CBC WITH DIFFERENTIAL/PLATELET
Basophils Absolute: 0 10*3/uL (ref 0.0–0.1)
Eosinophils Absolute: 0.2 10*3/uL (ref 0.0–0.7)
Lymphocytes Relative: 23.4 % (ref 12.0–46.0)
MCHC: 32.6 g/dL (ref 30.0–36.0)
Neutrophils Relative %: 67 % (ref 43.0–77.0)
Platelets: 311 10*3/uL (ref 150.0–400.0)
RDW: 13.8 % (ref 11.5–14.6)

## 2012-07-25 LAB — COMPREHENSIVE METABOLIC PANEL
ALT: 20 U/L (ref 0–35)
AST: 18 U/L (ref 0–37)
Albumin: 4.2 g/dL (ref 3.5–5.2)
Calcium: 9.4 mg/dL (ref 8.4–10.5)
Chloride: 108 mEq/L (ref 96–112)
Creatinine, Ser: 0.8 mg/dL (ref 0.4–1.2)
Potassium: 4.5 mEq/L (ref 3.5–5.1)
Sodium: 144 mEq/L (ref 135–145)
Total Protein: 7.7 g/dL (ref 6.0–8.3)

## 2012-07-25 MED ORDER — TRAMADOL HCL ER 100 MG PO TB24: 100.0000 mg | ORAL_TABLET | Freq: Every day | ORAL | Status: DC | PRN

## 2012-07-25 MED ORDER — CYCLOBENZAPRINE HCL 10 MG PO TABS: 10.0000 mg | ORAL_TABLET | Freq: Three times a day (TID) | ORAL | Status: AC | PRN

## 2012-07-25 NOTE — Progress Notes (Signed)
Subjective:    Patient ID: Ann Wu, female    DOB: Oct 04, 1946, 66 y.o.   MRN: [id number]  HPI  Here for check up of chronic medical conditions and to review health mt list   Is doing ok - but disappointed about wt gain  Wt is up 13 lb with bmi of 30  Is still a non smoker since may the 14th -- she has been without the patches for a week , occ uses an elect cig   Saw vasc Dr Kizzie Bane in march Did PET scan  Aneurysm is 4.2 cm - still watching it closely  She watches closely for cp or other symptoms Also pulmonary surgeon -- watching pulm nodule  Going yearly   Sees accupuncture - for aches and pains/ back issues  That is very helpful  Also tramadol and muscle relaxer   bp is 128/70-- good   Zoster status - is interested in a vaccine  She will check with her insurance  Has grandaughter with autoimmune dz - she wants to protect herself   mammo--has not had one yet -was going to switch to somewhere here  Will schedule her own  She tends to get breast cysts - fibrocystic change -- occ breast pain - depends on caffeine   Flu shot- did get in the fall   Gyn care-had a hysterectomy in the past - for bleeding , no cancer  Complete No symptoms at all   colonosc 11/10  dexa- osteopenia   Due for labs today  Lipids Lab Results  Component Value Date   CHOL 168 09/07/2011   HDL 51.50 09/07/2011   LDLCALC 102* 09/07/2011   LDLDIRECT 146.9 08/31/2010   TRIG 73.0 09/07/2011   CHOLHDL 3 09/07/2011   is trying to watch the sat fats in the diet Is trying crestor again- and if she tolerates it - will call for a lab appt    Patient Active Problem List  Diagnosis  . COLONIC POLYPS  . HYPERLIPIDEMIA  . HYPERKALEMIA  . Former smoker  . ALLERGIC RHINITIS  . COPD  . FIBROCYSTIC BREAST DISEASE  . DUPUYTREN'S CONTRACTURE  . OSTEOPENIA  . SLEEP APNEA, MILD  . HYPERGLYCEMIA  . Back pain  . Abnormal chest x-ray  . Aneurysm of aorta  . Thoracic aortic aneurysm  . Hypertension    . Other screening mammogram  . Left-sided chest wall pain  . Depression with anxiety   Past Medical History  Diagnosis Date  . Allergic rhinitis, seasonal     mild  . COPD (chronic obstructive pulmonary disease)     likely per CXR  . Hyperlipidemia   . Osteopenia   . Smoker   . Dupuytren's contracture of hand   . Bulging lumbar disc 10/99    L 3-4, L 4-5  . Osteopenia     dexa 2003, worse 2006, slighty worse 2010  . Gastritis     EGD 03/2005, 11/10  . Thoracic aortic aneurysm 8/12  . Hypertension 09/07/2011   Past Surgical History  Procedure Date  . Cholecystectomy   . Gyn surgery 09/1999    hysterectomy- fibroids  . Cyst aspirations      x 2- breast   History  Substance Use Topics  . Smoking status: Former Smoker -- 1.0 packs/day for 51 years    Types: Cigarettes    Quit date: 08/14/2011  . Smokeless tobacco: Never Used  . Alcohol Use: No   Family History  Problem Relation Age of  Onset  . Cancer Mother     breast  . Coronary artery disease Mother   . Stomach cancer Father   . Heart attack Mother    Allergies  Allergen Reactions  . Amoxicillin-Pot Clavulanate   . Codeine     REACTION: nausea and vomiting   Current Outpatient Prescriptions on File Prior to Visit  Medication Sig Dispense Refill  . acetaminophen (TYLENOL) 325 MG tablet Take 650 mg by mouth every 6 (six) hours as needed.        . ALPRAZolam (XANAX) 0.5 MG tablet 1 by mouth once daily as needed for severe anxiety       . amLODipine (NORVASC) 10 MG tablet Take 1 tablet (10 mg total) by mouth daily.  30 tablet  11  . lisinopril (PRINIVIL,ZESTRIL) 5 MG tablet Take 1 tablet (5 mg total) by mouth daily.  30 tablet  11  . omeprazole (PRILOSEC) 20 MG capsule Take 1 capsule (20 mg total) by mouth daily as needed.  30 capsule  11  . traMADol (ULTRAM-ER) 100 MG 24 hr tablet Take 100 mg by mouth daily as needed.        . colesevelam (WELCHOL) 625 MG tablet 6 pills by mouth once daily  180 tablet  11  .  Misc Natural Products (OSTEO BI-FLEX ADV DOUBLE ST PO) As needed       . nicotine (NICODERM CQ - DOSED IN MG/24 HOURS) 14 mg/24hr patch Place 1 patch onto the skin daily.        . sertraline (ZOLOFT) 25 MG tablet Take 1 tablet (25 mg total) by mouth daily.  30 tablet  11       Review of Systems    Review of Systems  Constitutional: Negative for fever, appetite change, fatigue and unexpected weight change.  Eyes: Negative for pain and visual disturbance.  Respiratory: Negative for cough and shortness of breath.   Cardiovascular: Negative for cp or palpitations    Gastrointestinal: Negative for nausea, diarrhea and constipation.  Genitourinary: Negative for urgency and frequency.  Skin: Negative for pallor or rash   Neurological: Negative for weakness, light-headedness, numbness and headaches.  Hematological: Negative for adenopathy. Does not bruise/bleed easily.  Psychiatric/Behavioral: Negative for dysphoric mood. The patient is not nervous/anxious.      Objective:   Physical Exam  Constitutional: She appears well-developed and well-nourished. No distress.  HENT:  Head: Normocephalic and atraumatic.  Right Ear: External ear normal.  Left Ear: External ear normal.  Nose: Nose normal.  Mouth/Throat: Oropharynx is clear and moist.  Eyes: Conjunctivae and EOM are normal. Pupils are equal, round, and reactive to light. No scleral icterus.  Neck: Normal range of motion. Neck supple. No JVD present. Carotid bruit is not present. No thyromegaly present.  Cardiovascular: Normal rate, regular rhythm, normal heart sounds and intact distal pulses.  Exam reveals no gallop.   Pulmonary/Chest: Effort normal and breath sounds normal. No respiratory distress. She has no wheezes.  Abdominal: Soft. Bowel sounds are normal. She exhibits no distension, no abdominal bruit and no mass. There is no tenderness.  Genitourinary: No breast swelling, tenderness, discharge or bleeding.       Breast exam: No  mass, nodules, thickening, tenderness, bulging, retraction, inflamation, nipple discharge or skin changes noted.  No axillary or clavicular LA.  Chaperoned exam.    Musculoskeletal: She exhibits no edema and no tenderness.  Lymphadenopathy:    She has no cervical adenopathy.  Neurological: She is alert. She has  normal reflexes. No cranial nerve deficit. She exhibits normal muscle tone. Coordination normal.  Skin: Skin is warm and dry. No rash noted. No erythema. No pallor.  Psychiatric: She has a normal mood and affect.          Assessment & Plan:

## 2012-07-25 NOTE — Patient Instructions (Addendum)
Don't forget to schedule your annual mammogram and bone density test- both at [facility] If you are interested in a shingles/zoster vaccine - call your insurance to check on coverage,( you should not get it within 1 month of other vaccines) , then call us for a prescription  for it to take to a pharmacy that gives the shot  Labs today If you stay with crestor call and schedule labs in 6 weeks

## 2012-07-30 ENCOUNTER — Other Ambulatory Visit: Payer: Self-pay | Admitting: *Deleted

## 2012-07-30 MED ORDER — TRAMADOL HCL 50 MG PO TABS: 50.0000 mg | ORAL_TABLET | Freq: Four times a day (QID) | ORAL | Status: DC | PRN

## 2012-07-30 NOTE — Telephone Encounter (Signed)
Received fax from the pharmacy [facility] that tramadol ER was not covered under pt's insurance and was very expensive, pt wanted to change back to regular tramadol. rx sent to pharmacy by e-script

## 2012-12-04 ENCOUNTER — Telehealth: Payer: Self-pay | Admitting: Family Medicine

## 2012-12-04 NOTE — Telephone Encounter (Signed)
I cannot px an antibiotic without evaluating her - please schedule appt , thanks

## 2012-12-04 NOTE — Telephone Encounter (Signed)
Patient Information:  Caller Name: Ann Wu  Phone: [phone]  Patient: Ann, Wu  Gender: Female  DOB: [birth year]  Age: 66 Years  PCP: Ann Wu [facility])  Office Follow Up:  Does the office need to follow up with this patient?: Yes  Instructions For The Office: Pls read CAN note  RN Note:  Pt has history of bronchitis. All emergent symptoms ruled out per Cough protocol. Pt requesting Antibiotic to be called in [place], [place], [phone].  Pt last seen in office on 07-25-12.  Called returned at 1745 no appts remaining. PLEASE FOLLOW UP W/ PT IF ANTIBIOTIC WILL BE CALLED IN.  Symptoms  Reason For Call & Symptoms: Cough, Nasal Congestion, yellow in color, bad taste in mouth  Reviewed Health History In EMR: Yes  Reviewed Medications In EMR: Yes  Reviewed Allergies In EMR: Yes  Reviewed Surgeries / Procedures: Yes  Date of Onset of Symptoms: 11/30/2012  Treatments Tried: Tylenol sinus, vicks, and mucinex  Treatments Tried Worked: No  Guideline(s) Used:  Cough  Disposition Per Guideline:   See Today in Office  Reason For Disposition Reached:   Sinus pain persists after using nasal washes and pain medicine > 24 hours  Advice Given:  N/A

## 2012-12-05 ENCOUNTER — Ambulatory Visit (INDEPENDENT_AMBULATORY_CARE_PROVIDER_SITE_OTHER): Payer: Medicare Other | Admitting: Family Medicine

## 2012-12-05 ENCOUNTER — Encounter: Payer: Self-pay | Admitting: Family Medicine

## 2012-12-05 VITALS — BP 122/68 | HR 93 | Temp 98.0°F | Ht 62.0 in | Wt 152.2 lb

## 2012-12-05 DIAGNOSIS — J209 Acute bronchitis, unspecified: Secondary | ICD-10-CM

## 2012-12-05 MED ORDER — ALBUTEROL SULFATE HFA 108 (90 BASE) MCG/ACT IN AERS: 2.0000 | INHALATION_SPRAY | RESPIRATORY_TRACT | Status: DC | PRN

## 2012-12-05 MED ORDER — LEVOFLOXACIN 500 MG PO TABS: 500.0000 mg | ORAL_TABLET | Freq: Every day | ORAL | Status: DC

## 2012-12-05 NOTE — Assessment & Plan Note (Signed)
In pt with copd and some sinus infx symptoms also (s/p uri)- R maxillary sinus pain  tx with levaquin and update Disc symptomatic care - see instructions on AVS Refilled inhaler

## 2012-12-05 NOTE — Telephone Encounter (Signed)
appt scheduled for today at 12:15

## 2012-12-05 NOTE — Patient Instructions (Addendum)
Drink lots of fluids  Take the levaquin as directed for sinus infection and bronchitis Try not to smoke Use inhaler as needed  A cough medicine with guifenesin will help loosen up mucous - take it with a lot of water  Update if not starting to improve in a week or if worsening

## 2012-12-05 NOTE — Progress Notes (Signed)
Subjective:    Patient ID: Ann Wu, female    DOB: 10-14-1946, 66 y.o.   MRN: [id number]  HPI Here with a cough and uri symptoms  Was worst yesterday  Is coughing up mucous with a bad taste - and is yellow  No wheeze or sob at all   Low grade fever  Started on Saturday- got worse mid week   Her ribs are sore on the L side now again   Very bad coughing spells   Sinus headache-takes tylenol sinus for that   Is using vics vapor rub and halls cough drops Took one dose of delsym   Patient Active Problem List  Diagnosis  . COLONIC POLYPS  . HYPERLIPIDEMIA  . HYPERKALEMIA  . Former smoker  . ALLERGIC RHINITIS  . COPD  . FIBROCYSTIC BREAST DISEASE  . DUPUYTREN'S CONTRACTURE  . OSTEOPENIA  . SLEEP APNEA, MILD  . HYPERGLYCEMIA  . Back pain  . Abnormal chest x-ray  . Aneurysm of aorta  . Thoracic aortic aneurysm  . Hypertension  . Other screening mammogram  . Left-sided chest wall pain  . Depression with anxiety   Past Medical History  Diagnosis Date  . Allergic rhinitis, seasonal     mild  . COPD (chronic obstructive pulmonary disease)     likely per CXR  . Hyperlipidemia   . Osteopenia   . Smoker   . Dupuytren's contracture of hand   . Bulging lumbar disc 10/99    L 3-4, L 4-5  . Osteopenia     dexa 2003, worse 2006, slighty worse 2010  . Gastritis     EGD 03/2005, 11/10  . Thoracic aortic aneurysm 8/12  . Hypertension 09/07/2011   Past Surgical History  Procedure Date  . Cholecystectomy   . Gyn surgery 09/1999    hysterectomy- fibroids  . Cyst aspirations      x 2- breast   History  Substance Use Topics  . Smoking status: Current Some Day Smoker -- 51 years    Types: Cigarettes    Last Attempt to Quit: 08/14/2011  . Smokeless tobacco: Never Used     Comment: occasional cigaretts   . Alcohol Use: No   Family History  Problem Relation Age of Onset  . Cancer Mother     breast  . Coronary artery disease Mother   . Stomach cancer Father     . Heart attack Mother    Allergies  Allergen Reactions  . Amoxicillin-Pot Clavulanate   . Codeine     REACTION: nausea and vomiting   Current Outpatient Prescriptions on File Prior to Visit  Medication Sig Dispense Refill  . acetaminophen (TYLENOL) 325 MG tablet Take 650 mg by mouth every 6 (six) hours as needed.        Marland Kitchen omeprazole (PRILOSEC) 20 MG capsule Take 1 capsule (20 mg total) by mouth daily as needed.  30 capsule  11  . traMADol (ULTRAM) 50 MG tablet Take 1-2 tablets (50-100 mg total) by mouth every 6 (six) hours as needed.  30 tablet  5  . amLODipine (NORVASC) 10 MG tablet Take 1 tablet (10 mg total) by mouth daily.  30 tablet  11  . lisinopril (PRINIVIL,ZESTRIL) 5 MG tablet Take 1 tablet (5 mg total) by mouth daily.  30 tablet  11  . sertraline (ZOLOFT) 25 MG tablet Take 1 tablet (25 mg total) by mouth daily.  30 tablet  11        Review of  Systems Review of Systems  Constitutional: Negative for fever, appetite change, fatigue and unexpected weight change.  ENT pos for cong and facial pain and ST Eyes: Negative for pain and visual disturbance.  Respiratory: Negative for  shortness of breath.   Cardiovascular: Negative for cp or palpitations    Gastrointestinal: Negative for nausea, diarrhea and constipation.  Genitourinary: Negative for urgency and frequency.  Skin: Negative for pallor or rash   Neurological: Negative for weakness, light-headedness, numbness and headaches.  Hematological: Negative for adenopathy. Does not bruise/bleed easily.  Psychiatric/Behavioral: Negative for dysphoric mood. The patient is not nervous/anxious.         Objective:   Physical Exam  Constitutional: She appears well-developed and well-nourished. No distress.  HENT:  Head: Normocephalic and atraumatic.  Right Ear: External ear normal.  Left Ear: External ear normal.  Mouth/Throat: Oropharynx is clear and moist.       Nares are injected and congested  L maxillary sinus  tenderness Clear post nasal drip   Eyes: Conjunctivae normal and EOM are normal. Pupils are equal, round, and reactive to light. Right eye exhibits no discharge. Left eye exhibits no discharge.  Neck: Normal range of motion. Neck supple. No thyromegaly present.  Cardiovascular: Normal rate and regular rhythm.   Pulmonary/Chest: Effort normal and breath sounds normal. No respiratory distress. She has no wheezes. She has no rales.       Harsh bs Wheeze on forced exp only Diffusely distant bs   Abdominal: Soft. Bowel sounds are normal.  Lymphadenopathy:    She has no cervical adenopathy.  Skin: Skin is warm and dry. No rash noted.  Psychiatric: She has a normal mood and affect.          Assessment & Plan:

## 2013-09-09 ENCOUNTER — Other Ambulatory Visit: Payer: Self-pay | Admitting: *Deleted

## 2013-09-09 MED ORDER — TRAMADOL HCL 50 MG PO TABS: 50.0000 mg | ORAL_TABLET | Freq: Four times a day (QID) | ORAL | Status: DC | PRN

## 2013-09-09 NOTE — Telephone Encounter (Signed)
Px written for call in   

## 2013-09-09 NOTE — Telephone Encounter (Signed)
Rx called in as prescribed 

## 2013-09-09 NOTE — Telephone Encounter (Signed)
Last filled 03/17/13 

## 2014-03-09 ENCOUNTER — Ambulatory Visit (INDEPENDENT_AMBULATORY_CARE_PROVIDER_SITE_OTHER): Payer: Medicare Other | Admitting: Family Medicine

## 2014-03-09 ENCOUNTER — Telehealth: Payer: Self-pay | Admitting: Family Medicine

## 2014-03-09 ENCOUNTER — Encounter: Payer: Self-pay | Admitting: Family Medicine

## 2014-03-09 VITALS — BP 120/70 | HR 93 | Temp 98.5°F | Ht 62.0 in | Wt 152.2 lb

## 2014-03-09 DIAGNOSIS — I10 Essential (primary) hypertension: Secondary | ICD-10-CM

## 2014-03-09 DIAGNOSIS — F17200 Nicotine dependence, unspecified, uncomplicated: Secondary | ICD-10-CM

## 2014-03-09 DIAGNOSIS — J209 Acute bronchitis, unspecified: Secondary | ICD-10-CM

## 2014-03-09 DIAGNOSIS — E785 Hyperlipidemia, unspecified: Secondary | ICD-10-CM

## 2014-03-09 DIAGNOSIS — M549 Dorsalgia, unspecified: Secondary | ICD-10-CM

## 2014-03-09 DIAGNOSIS — F17210 Nicotine dependence, cigarettes, uncomplicated: Secondary | ICD-10-CM

## 2014-03-09 DIAGNOSIS — Z1231 Encounter for screening mammogram for malignant neoplasm of breast: Secondary | ICD-10-CM

## 2014-03-09 DIAGNOSIS — R7309 Other abnormal glucose: Secondary | ICD-10-CM

## 2014-03-09 LAB — CBC WITH DIFFERENTIAL/PLATELET
BASOS PCT: 0.3 % (ref 0.0–3.0)
Basophils Absolute: 0 10*3/uL (ref 0.0–0.1)
EOS PCT: 1.1 % (ref 0.0–5.0)
Eosinophils Absolute: 0.1 10*3/uL (ref 0.0–0.7)
HCT: 48.3 % — ABNORMAL HIGH (ref 36.0–46.0)
HEMOGLOBIN: 16 g/dL — AB (ref 12.0–15.0)
LYMPHS PCT: 20.8 % (ref 12.0–46.0)
Lymphs Abs: 2.1 10*3/uL (ref 0.7–4.0)
MCHC: 33.1 g/dL (ref 30.0–36.0)
MCV: 84.6 fl (ref 78.0–100.0)
MONOS PCT: 6.3 % (ref 3.0–12.0)
Monocytes Absolute: 0.6 10*3/uL (ref 0.1–1.0)
NEUTROS ABS: 7.3 10*3/uL (ref 1.4–7.7)
NEUTROS PCT: 71.5 % (ref 43.0–77.0)
Platelets: 270 10*3/uL (ref 150.0–400.0)
RBC: 5.71 Mil/uL — AB (ref 3.87–5.11)
RDW: 14.3 % (ref 11.5–14.6)
WBC: 10.3 10*3/uL (ref 4.5–10.5)

## 2014-03-09 LAB — COMPREHENSIVE METABOLIC PANEL
ALBUMIN: 4.1 g/dL (ref 3.5–5.2)
ALT: 21 U/L (ref 0–35)
AST: 15 U/L (ref 0–37)
Alkaline Phosphatase: 82 U/L (ref 39–117)
BUN: 12 mg/dL (ref 6–23)
CALCIUM: 9.6 mg/dL (ref 8.4–10.5)
CHLORIDE: 106 meq/L (ref 96–112)
CO2: 28 meq/L (ref 19–32)
CREATININE: 0.8 mg/dL (ref 0.4–1.2)
GFR: 72.69 mL/min (ref >60.00)
GLUCOSE: 106 mg/dL — AB (ref 70–99)
POTASSIUM: 4.8 meq/L (ref 3.5–5.1)
Sodium: 142 mEq/L (ref 135–145)
Total Bilirubin: 0.8 mg/dL (ref 0.3–1.2)
Total Protein: 7.5 g/dL (ref 6.0–8.3)

## 2014-03-09 LAB — LIPID PANEL
CHOL/HDL RATIO: 5
Cholesterol: 166 mg/dL (ref 0–200)
HDL: 34.8 mg/dL — AB (ref >39.00)
LDL CALC: 106 mg/dL — AB (ref 0–99)
TRIGLYCERIDES: 127 mg/dL (ref 0.0–149.0)
VLDL: 25.4 mg/dL (ref 0.0–40.0)

## 2014-03-09 LAB — TSH: TSH: 0.89 u[IU]/mL (ref 0.35–5.50)

## 2014-03-09 LAB — HEMOGLOBIN A1C: Hgb A1c MFr Bld: 5.9 % (ref 4.6–6.5)

## 2014-03-09 MED ORDER — CYCLOBENZAPRINE HCL 5 MG PO TABS: 5.0000 mg | ORAL_TABLET | Freq: Three times a day (TID) | ORAL | Status: DC | PRN

## 2014-03-09 MED ORDER — ROSUVASTATIN CALCIUM 10 MG PO TABS: ORAL_TABLET | ORAL | Status: DC

## 2014-03-09 MED ORDER — TRAMADOL HCL 50 MG PO TABS: 50.0000 mg | ORAL_TABLET | Freq: Four times a day (QID) | ORAL | Status: DC | PRN

## 2014-03-09 NOTE — Assessment & Plan Note (Signed)
Intermittent Worse today after she pulled weeds yesterday Refilled ultram and flexeril which she takes infrequently

## 2014-03-09 NOTE — Assessment & Plan Note (Signed)
Pt has gone to [facility] in the past and interested now in annual screen mammo at [facility] Ref done Enc self exams

## 2014-03-09 NOTE — Assessment & Plan Note (Signed)
Suspect viral from uri Disc symptomatic care - see instructions on AVS Update if not starting to improve in a week or if worsening  -esp if fever or wheeze or sob

## 2014-03-09 NOTE — Assessment & Plan Note (Signed)
Pt continues to smoke occ cig or e - cig  Disc in detail risks of smoking and possible outcomes including copd, vascular/ heart disease, cancer , respiratory and sinus infections  Pt voices understanding She is not ready to totally quit She understands the imp of cessation in light of her enlarging AAA as well

## 2014-03-09 NOTE — Patient Instructions (Addendum)
Lab today Schedule fasting lab in 6 weeks Keep thinking about quitting smoking  Stop up front for a mammogam referral  If cough or upper respiratory symptoms get worse -please let me know  If you are interested in a shingles/zoster vaccine - call your insurance to check on coverage,( you should not get it within 1 month of other vaccines) , then call us for a prescription  for it to take to a pharmacy that gives the shot , or make a nurse visit to get it here depending on your coverage

## 2014-03-09 NOTE — Progress Notes (Signed)
Subjective:    Patient ID: Ann Wu, female    DOB: 08-18-1946, 68 y.o.   MRN: [id number]  HPI Here for f/u of chronic health problems   Had check up of aneurysm on Friday - 4.6 cm -- has gradually inc  Cannot have surgery until it is 5 cm  Continues to watch it   Has a uri  On mucinex or delsym  Husband brought it home  Mucous is slt yellow -not a lot of it No fever  Coughs really hard at times  Not hearing any wheezing -she has an inhaler at home - used 2 puffs Sunday night  Does not think she has more than a head and chest cold  Coughs all night long    Wt is stable with bmi of 27  bp is stable today  No cp or palpitations or headaches or edema  No side effects to medicines  Bp had gone up in surgeon's office  BP Readings from Last 3 Encounters:  03/09/14 120/70  12/05/12 122/68  07/25/12 128/74     Hx of hyperglycemia Lab Results  Component Value Date   HGBA1C 6.0 09/07/2011    Hx of hyperlipidemia Lab Results  Component Value Date   CHOL 226* 07/25/2012   HDL 47.40 07/25/2012   LDLCALC 102* 09/07/2011   LDLDIRECT 166.0 07/25/2012   TRIG 116.0 07/25/2012   CHOLHDL 5 07/25/2012  her surgeon strongly wants her to take a statin She has been intolerant of all - even every other day  Suggested twice weekly   Due for labs  Smoking status - overall much better -she slips infrequently and does not think she will quit completely Worse with stressors She has an e cigarette - with nicotine   Patient Active Problem List   Diagnosis Date Noted  . Acute bronchitis 12/05/2012  . Depression with anxiety 10/09/2011  . Left-sided chest wall pain 09/10/2011  . Hypertension 09/07/2011  . Other screening mammogram 09/07/2011  . Thoracic aortic aneurysm   . Aneurysm of aorta 08/07/2011  . Abnormal chest x-ray 07/26/2011  . Back pain 07/24/2011  . HYPERKALEMIA 09/04/2010  . 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 HYPERGLYCEMIA 09/04/2010  . COLONIC POLYPS  03/31/2009  . HYPERLIPIDEMIA 03/24/2007  . Former smoker 03/24/2007  . ALLERGIC RHINITIS 03/24/2007  . COPD 03/24/2007  . FIBROCYSTIC BREAST DISEASE 03/24/2007  . OSTEOPENIA 03/24/2007  . SLEEP APNEA, MILD 03/24/2007   Past Medical History  Diagnosis Date  . Allergic rhinitis, seasonal     mild  . COPD (chronic obstructive pulmonary disease)     likely per CXR  . Hyperlipidemia   . Osteopenia   . Smoker   . Dupuytren's contracture of hand   . Bulging lumbar disc 10/99    L 3-4, L 4-5  . Osteopenia     dexa 2003, worse 2006, slighty worse 2010  . Gastritis     EGD 03/2005, 11/10  . Thoracic aortic aneurysm 8/12  . Hypertension 09/07/2011   Past Surgical History  Procedure Laterality Date  . Cholecystectomy    . Gyn surgery  09/1999    hysterectomy- fibroids  . Cyst aspirations       x 2- breast   History  Substance Use Topics  . Smoking status: Current Some Day Smoker -- 51 years    Types: Cigarettes    Last Attempt to Quit: 08/14/2011  . Smokeless tobacco: Never Used     Comment: occasional cigaretts   .  Alcohol Use: No   Family History  Problem Relation Age of Onset  . Cancer Mother     breast  . Coronary artery disease Mother   . Stomach cancer Father   . Heart attack Mother    Allergies  Allergen Reactions  . Amoxicillin-Pot Clavulanate   . Codeine     REACTION: nausea and vomiting   Current Outpatient Prescriptions on File Prior to Visit  Medication Sig Dispense Refill  . acetaminophen (TYLENOL) 325 MG tablet Take 650 mg by mouth as needed.       Marland Kitchen albuterol (PROVENTIL HFA;VENTOLIN HFA) 108 (90 BASE) MCG/ACT inhaler Inhale 2 puffs into the lungs every 4 (four) hours as needed for wheezing.  1 Inhaler  3  . cyclobenzaprine (FLEXERIL) 5 MG tablet Take 5 mg by mouth as needed.      Marland Kitchen omeprazole (PRILOSEC) 20 MG capsule Take 1 capsule (20 mg total) by mouth daily as needed.  30 capsule  11  . traMADol (ULTRAM) 50 MG tablet Take 1-2 tablets (50-100 mg  total) by mouth every 6 (six) hours as needed.  30 tablet  1  . amLODipine (NORVASC) 10 MG tablet Take 1 tablet (10 mg total) by mouth daily.  30 tablet  11  . levofloxacin (LEVAQUIN) 500 MG tablet Take 1 tablet (500 mg total) by mouth daily.  7 tablet  0  . lisinopril (PRINIVIL,ZESTRIL) 5 MG tablet Take 1 tablet (5 mg total) by mouth daily.  30 tablet  11  . sertraline (ZOLOFT) 25 MG tablet Take 1 tablet (25 mg total) by mouth daily.  30 tablet  11   No current facility-administered medications on file prior to visit.    Review of Systems    Review of Systems  Constitutional: Negative for fever, appetite change, fatigue and unexpected weight change.  Eyes: Negative for pain and visual disturbance.  Respiratory: Negative for cough and shortness of breath.   Cardiovascular: Negative for cp or palpitations    Gastrointestinal: Negative for nausea, diarrhea and constipation.  Genitourinary: Negative for urgency and frequency.  Skin: Negative for pallor or rash   Neurological: Negative for weakness, light-headedness, numbness and headaches.  Hematological: Negative for adenopathy. Does not bruise/bleed easily.  Psychiatric/Behavioral: Negative for dysphoric mood. The patient is not nervous/anxious.  (at times she is -not today), pos for stressors     Objective:   Physical Exam  Constitutional: She appears well-developed and well-nourished. No distress.  HENT:  Head: Normocephalic and atraumatic.  Right Ear: External ear normal.  Left Ear: External ear normal.  Nose: Nose normal.  Mouth/Throat: Oropharynx is clear and moist.  Eyes: Conjunctivae and EOM are normal. Pupils are equal, round, and reactive to light. Right eye exhibits no discharge. Left eye exhibits no discharge. No scleral icterus.  Neck: Normal range of motion. Neck supple. No JVD present. Carotid bruit is not present. No thyromegaly present.  Cardiovascular: Normal rate, regular rhythm, normal heart sounds and intact  distal pulses.  Exam reveals no gallop.   Pulmonary/Chest: Effort normal and breath sounds normal. No respiratory distress. She has no wheezes. She has no rales.  Diffusely distant bs  No rhonchi or wheezes Breathing comfortably occ dry cough  Abdominal: Soft. Bowel sounds are normal. She exhibits no distension, no abdominal bruit and no mass. There is no tenderness.  Musculoskeletal: She exhibits no edema and no tenderness.  Lymphadenopathy:    She has no cervical adenopathy.  Neurological: She is alert. She has normal reflexes.  No cranial nerve deficit. She exhibits normal muscle tone. Coordination normal.  Skin: Skin is warm and dry. No rash noted. No erythema. No pallor.  lentigos noted   Psychiatric: She has a normal mood and affect.          Assessment & Plan:

## 2014-03-09 NOTE — Assessment & Plan Note (Signed)
a1C today Disc imp of good low glycemic diet and exercise

## 2014-03-09 NOTE — Assessment & Plan Note (Signed)
Lab today In past intol of all statins even QOD Her surgeon stressed imp of chol control -she will try 10 mg crestor twice weekly  Lab today Lab again in 6 weeks to see if helpful  Rev low sat fat diet  If not tolerated-? If could consider zetia or welchol  In light of AAA- controlling cholesterol is important  Esp because pt continues to smoke as well

## 2014-03-09 NOTE — Progress Notes (Signed)
Pre visit review using our clinic review tool, if applicable. No additional management support is needed unless otherwise documented below in the visit note. 

## 2014-03-09 NOTE — Assessment & Plan Note (Signed)
bp is stable today  No cp or palpitations or headaches or edema  No side effects to medicines  BP Readings from Last 3 Encounters:  03/09/14 120/70  12/05/12 122/68  07/25/12 128/74     ? Explanation for her recent high bp at surgeon's office except that she was feeling sick that day Labs today

## 2014-03-09 NOTE — Telephone Encounter (Signed)
Relevant patient education mailed to patient.  

## 2014-03-10 ENCOUNTER — Telehealth: Payer: Self-pay | Admitting: Family Medicine

## 2014-03-10 NOTE — Telephone Encounter (Signed)
Relevant patient education mailed to patient.  

## 2014-04-05 ENCOUNTER — Ambulatory Visit: Payer: Self-pay | Admitting: Family Medicine

## 2014-04-09 ENCOUNTER — Encounter: Payer: Self-pay | Admitting: Family Medicine

## 2014-04-13 ENCOUNTER — Encounter: Payer: Self-pay | Admitting: Family Medicine

## 2014-04-20 ENCOUNTER — Other Ambulatory Visit: Payer: Medicare Other

## 2014-11-29 ENCOUNTER — Other Ambulatory Visit: Payer: Self-pay

## 2014-11-29 MED ORDER — ALPRAZOLAM 0.5 MG PO TABS: ORAL_TABLET | ORAL | Status: DC

## 2014-11-29 NOTE — Telephone Encounter (Signed)
Rx called in as prescribed 

## 2014-11-29 NOTE — Telephone Encounter (Signed)
Pt left v/m requesting refill xanax to [facility]. Pt last seen 03/09/14; pt has appt scheduled 12/20/2014 for refill of xanax. Xanax was on hx med list.Please advise.

## 2014-11-29 NOTE — Telephone Encounter (Signed)
Px written for call in   

## 2014-12-20 ENCOUNTER — Encounter: Payer: Self-pay | Admitting: Family Medicine

## 2014-12-20 ENCOUNTER — Ambulatory Visit (INDEPENDENT_AMBULATORY_CARE_PROVIDER_SITE_OTHER): Payer: Medicare Other | Admitting: Family Medicine

## 2014-12-20 VITALS — BP 146/68 | HR 98 | Temp 98.5°F | Ht 62.0 in | Wt 145.8 lb

## 2014-12-20 DIAGNOSIS — F410 Panic disorder [episodic paroxysmal anxiety] without agoraphobia: Secondary | ICD-10-CM

## 2014-12-20 DIAGNOSIS — E785 Hyperlipidemia, unspecified: Secondary | ICD-10-CM

## 2014-12-20 LAB — LIPID PANEL
Cholesterol: 204 mg/dL — ABNORMAL HIGH (ref 0–200)
HDL: 38.2 mg/dL — ABNORMAL LOW (ref >39.00)
LDL CALC: 138 mg/dL — AB (ref 0–99)
NonHDL: 165.8
Total CHOL/HDL Ratio: 5
Triglycerides: 138 mg/dL (ref 0.0–149.0)
VLDL: 27.6 mg/dL (ref 0.0–40.0)

## 2014-12-20 MED ORDER — SERTRALINE HCL 25 MG PO TABS: 25.0000 mg | ORAL_TABLET | Freq: Every day | ORAL | Status: DC

## 2014-12-20 NOTE — Progress Notes (Signed)
Pre visit review using our clinic review tool, if applicable. No additional management support is needed unless otherwise documented below in the visit note. 

## 2014-12-20 NOTE — Progress Notes (Signed)
Subjective:    Patient ID: Ann Wu, female    DOB: Feb 03, 1946, 69 y.o.   MRN: [id number]  HPI Here for refill of her xanax   Mood issues  ? If anxiety  Episodes - of "feeling like her heart was jumping out of her skin" - even though her pulse rate is fine and not high  After that feels a bit "swimmy headed"- brief  At home and at the grocery store  bp was fine by the time she gets home to her cuff  Does feel anxious when this happens - but not full blown panicky  Episodes last about 5-15 minutes (will be swimmy headed for 30 sec at most)  She can feel it coming on and then tries to relax herself   She got a refill of her xanax 12/21  Has not needed it since 2012  It makes her sleepy (she tries to take 1/2) - cannot fxn during the day with them  Does not kick in in time   Has a fair amount of stress    Still gets aneurysm checks yearly  Last time 4.6 mm   Patient Active Problem List   Diagnosis Date Noted  . Acute bronchitis 12/05/2012  . Depression with anxiety 10/09/2011  . Hypertension 09/07/2011  . Other screening mammogram 09/07/2011  . Thoracic aortic aneurysm   . Aneurysm of aorta 08/07/2011  . Back pain 07/24/2011  . HYPERKALEMIA 09/04/2010  . 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 HYPERGLYCEMIA 09/04/2010  . COLONIC POLYPS 03/31/2009  . HYPERLIPIDEMIA 03/24/2007  . Light smoker 03/24/2007  . ALLERGIC RHINITIS 03/24/2007  . COPD 03/24/2007  . FIBROCYSTIC BREAST DISEASE 03/24/2007  . OSTEOPENIA 03/24/2007  . SLEEP APNEA, MILD 03/24/2007   Past Medical History  Diagnosis Date  . Allergic rhinitis, seasonal     mild  . COPD (chronic obstructive pulmonary disease)     likely per CXR  . Hyperlipidemia   . Osteopenia   . Smoker   . Dupuytren's contracture of hand   . Bulging lumbar disc 10/99    L 3-4, L 4-5  . Osteopenia     dexa 2003, worse 2006, slighty worse 2010  . Gastritis     EGD 03/2005, 11/10  . Thoracic aortic aneurysm 8/12  .  Hypertension 09/07/2011   Past Surgical History  Procedure Laterality Date  . Cholecystectomy    . Gyn surgery  09/1999    hysterectomy- fibroids  . Cyst aspirations       x 2- breast   History  Substance Use Topics  . Smoking status: Current Some Day Smoker -- 51 years    Types: Cigarettes  . Smokeless tobacco: Never Used     Comment: occasional cigaretts   . Alcohol Use: No   Family History  Problem Relation Age of Onset  . Cancer Mother     breast  . Coronary artery disease Mother   . Stomach cancer Father   . Heart attack Mother    Allergies  Allergen Reactions  . Amoxicillin-Pot Clavulanate   . Codeine     REACTION: nausea and vomiting   Current Outpatient Prescriptions on File Prior to Visit  Medication Sig Dispense Refill  . acetaminophen (TYLENOL) 325 MG tablet Take 650 mg by mouth as needed.     . ALPRAZolam (XANAX) 0.5 MG tablet 1 by mouth once daily as needed for severe anxiety 30 tablet 0  . cyclobenzaprine (FLEXERIL) 5 MG tablet Take 1 tablet (5  mg total) by mouth 3 (three) times daily as needed. 30 tablet 3  . omeprazole (PRILOSEC) 20 MG capsule Take 1 capsule (20 mg total) by mouth daily as needed. 30 capsule 11  . traMADol (ULTRAM) 50 MG tablet Take 1 tablet (50 mg total) by mouth every 6 (six) hours as needed. 30 tablet 1   No current facility-administered medications on file prior to visit.     Review of Systems Review of Systems  Constitutional: Negative for fever, appetite change, fatigue and unexpected weight change.  Eyes: Negative for pain and visual disturbance.  Respiratory: Negative for cough and shortness of breath.   Cardiovascular: Negative for cp or palpitations    Gastrointestinal: Negative for nausea, diarrhea and constipation.  Genitourinary: Negative for urgency and frequency.  Skin: Negative for pallor or rash   Neurological: Negative for weakness, light-headedness, numbness and headaches.  Hematological: Negative for adenopathy.  Does not bruise/bleed easily.  Psychiatric/Behavioral: pos for dysphoric mood/irritability/anxiety attacks , neg for SI        Objective:   Physical Exam  Constitutional: She appears well-developed and well-nourished. No distress.  HENT:  Head: Normocephalic and atraumatic.  Mouth/Throat: Oropharynx is clear and moist.  Eyes: Conjunctivae and EOM are normal. Pupils are equal, round, and reactive to light. No scleral icterus.  Neck: Normal range of motion. Neck supple. No JVD present. Carotid bruit is not present.  Cardiovascular: Normal rate, regular rhythm and intact distal pulses.  Exam reveals no gallop.   Pulmonary/Chest: Effort normal and breath sounds normal. No respiratory distress. She has no wheezes. She has no rales.  Diffusely distant bs   Lymphadenopathy:    She has no cervical adenopathy.  Neurological: She is alert.  Skin: Skin is warm and dry. No erythema. No pallor.  Psychiatric: Her speech is normal and behavior is normal. Her affect is blunt. Her affect is not labile and not inappropriate. Thought content is not paranoid. She expresses no homicidal and no suicidal ideation.  Talkative  Disc symptoms and stressors  Not tearful           Assessment & Plan:   Problem List Items Addressed This Visit      Other   Anxiety attack - Primary    Disc new anx attacks  Handout given Reviewed stressors/ coping techniques/symptoms/ support sources/ tx options and side effects in detail today  Some anx/irritability  Trial of zoloft 35 which worked well in the past  Declined counseling  Disc health habits     Relevant Medications      sertraline (ZOLOFT) tablet   Hyperlipidemia    Disc goals for lipids and reasons to control them Rev labs with pt Rev low sat fat diet in detail  Intol of statin  -is off of it  Wants a check Has hx of aneurysm and smokes     Relevant Orders      Lipid panel (Completed)

## 2014-12-20 NOTE — Assessment & Plan Note (Signed)
Disc goals for lipids and reasons to control them Rev labs with pt Rev low sat fat diet in detail  Intol of statin  -is off of it  Wants a check Has hx of aneurysm and smokes

## 2014-12-20 NOTE — Assessment & Plan Note (Addendum)
Disc new anx attacks  Handout given Reviewed stressors/ coping techniques/symptoms/ support sources/ tx options and side effects in detail today  Some anx/irritability  Trial of zoloft 35 which worked well in the past  Declined counseling  Disc health habits  >25 minutes spent in face to face time with patient, >50% spent in counselling or coordination of care

## 2014-12-20 NOTE — Patient Instructions (Signed)
Start generic zoloft for suspected panic attacks /anxiety attacks  Take 25 mg once daily to suppress them  Take care of yourself  If side effects - or if you feel worse / more anxious /or depressed -stop it and let me know   Use xanax only if needed

## 2014-12-21 ENCOUNTER — Encounter: Payer: Self-pay | Admitting: *Deleted

## 2015-01-06 ENCOUNTER — Telehealth: Payer: Self-pay | Admitting: Family Medicine

## 2015-01-06 NOTE — Telephone Encounter (Signed)
[facility] Patient Name: [name] DOB: Jun 29, 1946 Initial Comment Caller states she has a sinus infection and is requesting some medication. Nurse Assessment Nurse: Germain Osgood RN, Opal Sidles Date/Time Ann Wu Time): 01/06/2015 4:35:48 PM Confirm and document reason for call. If symptomatic, describe symptoms. ---Caller reports she has had a sinus infection that has settled into her chest. Is requesting Z-Pac be started as soon as possible. Has the patient traveled out of the country within the last 30 days? ---Not Applicable Does the patient require triage? ---Declined Triage Please document clinical information provided and list any resource used. ---Caller advised MD does not authorize abx after hours. Requires appointment Offered Triage and appointment. States she will call office in A.M since she was just seen 2 weeks ago Guidelines Guideline Title Affirmed Question Affirmed Notes Final Disposition User

## 2015-01-07 ENCOUNTER — Telehealth: Payer: Self-pay | Admitting: Family Medicine

## 2015-01-07 MED ORDER — AZITHROMYCIN 250 MG PO TABS: ORAL_TABLET | ORAL | Status: DC

## 2015-01-07 NOTE — Telephone Encounter (Signed)
I will send in Ann Wu - since she has not been examined I cannot guarantee that she has a sinus infection so if no improvement please get seen in an urgent care wherever you go

## 2015-01-07 NOTE — Telephone Encounter (Signed)
Pt called back pt going out of town on 01/08/15 and return on 01/21/15. Pt has head congestion,when blows nose has green mucus, prod cough is thick and yellow, no fever last 24 hours. No wheezing or SOB. Pt taking tylenol sinus and mucinex. Since pt is going out of town pt wants Zpak called in. [place] [address]. Pt request cb.

## 2015-01-10 NOTE — Telephone Encounter (Signed)
Patient stated she have taken the Zpak and doing much better.

## 2015-01-19 NOTE — Telephone Encounter (Signed)
Made in error

## 2015-09-01 ENCOUNTER — Other Ambulatory Visit: Payer: Self-pay | Admitting: *Deleted

## 2015-09-01 NOTE — Telephone Encounter (Signed)
Fax refill request, last OV 12/20/14 and no future appt., last refilled on 03/09/14 #30 with 1 additional refill, please advise

## 2015-09-02 MED ORDER — TRAMADOL HCL 50 MG PO TABS: 50.0000 mg | ORAL_TABLET | Freq: Four times a day (QID) | ORAL | Status: DC | PRN

## 2015-09-02 NOTE — Telephone Encounter (Signed)
Rx called in as prescribed 

## 2015-09-02 NOTE — Telephone Encounter (Signed)
Px written for call in   

## 2015-10-21 ENCOUNTER — Other Ambulatory Visit: Payer: Self-pay | Admitting: *Deleted

## 2015-10-21 MED ORDER — SERTRALINE HCL 25 MG PO TABS: 25.0000 mg | ORAL_TABLET | Freq: Every day | ORAL | Status: DC

## 2015-11-02 ENCOUNTER — Telehealth: Payer: Self-pay | Admitting: Family Medicine

## 2015-11-02 NOTE — Telephone Encounter (Signed)
Patient Name: Ann Wu DOB: [birth year] Initial Comment caller states she is getting bronchitis - can she get a z pac called intoday - has had a death in the family Nurse Assessment Nurse: Ronnald Ramp, RN, Miranda Date/Time ([place] Time): 11/02/2015 9:52:05 AM Confirm and document reason for call. If symptomatic, describe symptoms. ---Caller states yesterday afternoon she started having a productive cough. States green bad tasting mucus. She had a low grade fever last night. Temp 99.1. Has the patient traveled out of the country within the last 30 days? ---Not Applicable Does the patient have any new or worsening symptoms? ---Yes Will a triage be completed? ---No Select reason for no triage. ---Patient declined Guidelines Guideline Title Affirmed Question Affirmed Notes Final Disposition User Clinical Call Ronnald Ramp, RN, Miranda Comments Caller is requesting a Z-pac be called in. She declined to complete triage. She states she will just go to the UC.

## 2015-11-02 NOTE — Telephone Encounter (Signed)
Aware she said she will go to [facility]

## 2015-11-29 ENCOUNTER — Encounter: Payer: Self-pay | Admitting: Family Medicine

## 2015-11-29 ENCOUNTER — Ambulatory Visit (INDEPENDENT_AMBULATORY_CARE_PROVIDER_SITE_OTHER): Payer: Medicare Other | Admitting: Family Medicine

## 2015-11-29 VITALS — BP 148/70 | HR 85 | Temp 98.9°F | Ht 62.0 in | Wt 151.0 lb

## 2015-11-29 DIAGNOSIS — J209 Acute bronchitis, unspecified: Secondary | ICD-10-CM | POA: Diagnosis not present

## 2015-11-29 MED ORDER — LEVOFLOXACIN 500 MG PO TABS: 500.0000 mg | ORAL_TABLET | Freq: Every day | ORAL | Status: DC

## 2015-11-29 MED ORDER — ALBUTEROL SULFATE HFA 108 (90 BASE) MCG/ACT IN AERS: 2.0000 | INHALATION_SPRAY | RESPIRATORY_TRACT | Status: DC | PRN

## 2015-11-29 MED ORDER — HYDROCODONE-HOMATROPINE 5-1.5 MG/5ML PO SYRP: 5.0000 mL | ORAL_SOLUTION | Freq: Three times a day (TID) | ORAL | Status: DC | PRN

## 2015-11-29 MED ORDER — PREDNISONE 10 MG PO TABS: ORAL_TABLET | ORAL | Status: DC

## 2015-11-29 NOTE — Progress Notes (Signed)
Pre visit review using our clinic review tool, if applicable. No additional management support is needed unless otherwise documented below in the visit note. 

## 2015-11-29 NOTE — Progress Notes (Signed)
Subjective:    Patient ID: Ann Wu, female    DOB: 10/17/1946, 69 y.o.   MRN: [id number]  HPI Here for uri symptoms   Sick since November  Walk in clinic - had zpak  Got better and then worse again   Bad cough - thick mucous- light yellow  Itchy uncomfortable R ear ST- worse with cough  Not wheezing unless she coughs  Hacking cough - with phlegm in throat   Taking mucinex (lots of fluids)  Delsym Cough drops  Tylenol or ibuprofen   No fever    Had the chills and fever fri and sat   Does not have an albuterol inhaler    Patient Active Problem List   Diagnosis Date Noted  . Anxiety attack 12/20/2014  . Acute bronchitis 12/05/2012  . Depression with anxiety 10/09/2011  . Hypertension 09/07/2011  . Other screening mammogram 09/07/2011  . Thoracic aortic aneurysm ([place])   . Aneurysm of aorta ([place]) 08/07/2011  . Back pain 07/24/2011  . HYPERKALEMIA 09/04/2010  . 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 HYPERGLYCEMIA 09/04/2010  . COLONIC POLYPS 03/31/2009  . Hyperlipidemia 03/24/2007  . Light smoker 03/24/2007  . ALLERGIC RHINITIS 03/24/2007  . COPD 03/24/2007  . FIBROCYSTIC BREAST DISEASE 03/24/2007  . OSTEOPENIA 03/24/2007  . SLEEP APNEA, MILD 03/24/2007   Past Medical History  Diagnosis Date  . Allergic rhinitis, seasonal     mild  . COPD (chronic obstructive pulmonary disease) ([place])     likely per CXR  . Hyperlipidemia   . Osteopenia   . Smoker   . Dupuytren's contracture of hand   . Bulging lumbar disc 10/99    L 3-4, L 4-5  . Osteopenia     dexa 2003, worse 2006, slighty worse 2010  . Gastritis     EGD 03/2005, 11/10  . Thoracic aortic aneurysm ([place]) 8/12  . Hypertension 09/07/2011   Past Surgical History  Procedure Laterality Date  . Cholecystectomy    . Gyn surgery  09/1999    hysterectomy- fibroids  . Cyst aspirations       x 2- breast   Social History  Substance Use Topics  . Smoking status: Current Some Day Smoker -- 51 years      Types: Cigarettes  . Smokeless tobacco: Never Used     Comment: occasional cigaretts   . Alcohol Use: No   Family History  Problem Relation Age of Onset  . Cancer Mother     breast  . Coronary artery disease Mother   . Stomach cancer Father   . Heart attack Mother    Allergies  Allergen Reactions  . Amoxicillin-Pot Clavulanate   . Codeine     REACTION: nausea and vomiting   Current Outpatient Prescriptions on File Prior to Visit  Medication Sig Dispense Refill  . acetaminophen (TYLENOL) 325 MG tablet Take 650 mg by mouth as needed.     . ALPRAZolam (XANAX) 0.5 MG tablet 1 by mouth once daily as needed for severe anxiety 30 tablet 0  . azithromycin (ZITHROMAX Z-PAK) 250 MG tablet Take 2 pills by mouth today and then 1 pill daily for 4 days 6 tablet 0  . cyclobenzaprine (FLEXERIL) 5 MG tablet Take 1 tablet (5 mg total) by mouth 3 (three) times daily as needed. 30 tablet 3  . omeprazole (PRILOSEC) 20 MG capsule Take 1 capsule (20 mg total) by mouth daily as needed. 30 capsule 11  . sertraline (ZOLOFT) 25 MG tablet Take 1  tablet (25 mg total) by mouth daily. 30 tablet 1  . traMADol (ULTRAM) 50 MG tablet Take 1 tablet (50 mg total) by mouth every 6 (six) hours as needed. 30 tablet 2   No current facility-administered medications on file prior to visit.    Review of Systems Review of Systems  Constitutional: Negative for fever, appetite change, and unexpected weight change.  ENT pos for post nasal drip and ST and ear discomfort  Eyes: Negative for pain and visual disturbance.  Respiratory: Negative for shortness of breath.  pos for cough and wheeze Cardiovascular: Negative for cp or palpitations    Gastrointestinal: Negative for nausea, diarrhea and constipation.  Genitourinary: Negative for urgency and frequency.  Skin: Negative for pallor or rash   Neurological: Negative for weakness, light-headedness, numbness and headaches.  Hematological: Negative for adenopathy. Does not  bruise/bleed easily.  Psychiatric/Behavioral: Negative for dysphoric mood. The patient is not nervous/anxious.         Objective:   Physical Exam  Constitutional: She appears well-developed and well-nourished. No distress.  Well app  HENT:  Head: Normocephalic and atraumatic.  Right Ear: External ear normal.  Left Ear: External ear normal.  Mouth/Throat: Oropharynx is clear and moist.  Nares are injected and congested  No sinus tenderness Clear rhinorrhea and post nasal drip   Eyes: Conjunctivae and EOM are normal. Pupils are equal, round, and reactive to light. Right eye exhibits no discharge. Left eye exhibits no discharge.  Neck: Normal range of motion. Neck supple.  Cardiovascular: Normal rate and normal heart sounds.   Pulmonary/Chest: Effort normal. No respiratory distress. She has wheezes. She has no rales. She exhibits no tenderness.  Distant bs with diffuse exp wheezes Nl exp phase time  Rhonchi scattered No rales  Upper airway sounds noted with throat clearing   Lymphadenopathy:    She has no cervical adenopathy.  Neurological: She is alert.  Skin: Skin is warm and dry. No rash noted.  Psychiatric: She has a normal mood and affect.          Assessment & Plan:   Problem List Items Addressed This Visit      Respiratory   Acute bronchitis with bronchospasm - Primary    In a smoker S/p tx with zpak - improved and then worsened  Cover with levaquin Prednisone taper  Albuterol mdi prn Hycodan for cough prn with caution of sedation  Disc symptomatic care - see instructions on AVS  Update if not starting to improve in a week or if worsening

## 2015-11-29 NOTE — Assessment & Plan Note (Signed)
In a smoker S/p tx with zpak - improved and then worsened  Cover with levaquin Prednisone taper  Albuterol mdi prn Hycodan for cough prn with caution of sedation  Disc symptomatic care - see instructions on AVS  Update if not starting to improve in a week or if worsening

## 2015-11-29 NOTE — Patient Instructions (Signed)
Drink lots of fluids Rest when you can  Try hycodan for cough when not working or driving mucinex is ok  Take levaquin as directed (bronchitis)  Prednisone taper as directed Albuterol inhaler as needed - 2 puffs

## 2016-01-31 ENCOUNTER — Other Ambulatory Visit: Payer: Self-pay

## 2016-01-31 MED ORDER — ALPRAZOLAM 0.5 MG PO TABS: ORAL_TABLET | ORAL | Status: DC

## 2016-01-31 NOTE — Telephone Encounter (Signed)
Refill called in to requesting pharmacy.

## 2016-01-31 NOTE — Telephone Encounter (Signed)
Px written for call in   

## 2016-01-31 NOTE — Telephone Encounter (Signed)
[address] [place] [address] request refill alprazolam. Last refilled # 30 on 11/29/2014. 12/20/2014 last med refill visit. 11/29/15 acute visit. No future appt scheduled.Please advise.

## 2016-03-23 DIAGNOSIS — Z720 Tobacco use: Secondary | ICD-10-CM | POA: Diagnosis not present

## 2016-03-23 DIAGNOSIS — I712 Thoracic aortic aneurysm, without rupture: Secondary | ICD-10-CM | POA: Diagnosis not present

## 2016-03-23 DIAGNOSIS — I2510 Atherosclerotic heart disease of native coronary artery without angina pectoris: Secondary | ICD-10-CM | POA: Diagnosis not present

## 2016-03-23 DIAGNOSIS — R69 Illness, unspecified: Secondary | ICD-10-CM | POA: Diagnosis not present

## 2016-03-23 DIAGNOSIS — R911 Solitary pulmonary nodule: Secondary | ICD-10-CM | POA: Diagnosis not present

## 2016-03-23 DIAGNOSIS — R590 Localized enlarged lymph nodes: Secondary | ICD-10-CM | POA: Diagnosis not present

## 2016-03-23 DIAGNOSIS — R918 Other nonspecific abnormal finding of lung field: Secondary | ICD-10-CM | POA: Diagnosis not present

## 2016-03-23 DIAGNOSIS — E784 Other hyperlipidemia: Secondary | ICD-10-CM | POA: Diagnosis not present

## 2016-07-17 ENCOUNTER — Other Ambulatory Visit: Payer: Self-pay | Admitting: *Deleted

## 2016-07-17 NOTE — Telephone Encounter (Signed)
Last filled on 10/21/15 330 with 1 additional refill. Pt had an acute appt on 11/29/15 but no recent f/u or CPE, please advise

## 2016-07-17 NOTE — Telephone Encounter (Signed)
Please schedule PE (with amw) or 30 min f/u in 6 mo --her choice  Refill until then thanks

## 2016-07-19 MED ORDER — SERTRALINE HCL 25 MG PO TABS: 25.0000 mg | ORAL_TABLET | Freq: Every day | ORAL | 1 refills | Status: DC

## 2016-08-02 DIAGNOSIS — Z0000 Encounter for general adult medical examination without abnormal findings: Secondary | ICD-10-CM | POA: Diagnosis not present

## 2016-08-02 DIAGNOSIS — R69 Illness, unspecified: Secondary | ICD-10-CM | POA: Diagnosis not present

## 2016-08-02 DIAGNOSIS — I712 Thoracic aortic aneurysm, without rupture: Secondary | ICD-10-CM | POA: Diagnosis not present

## 2016-09-11 ENCOUNTER — Other Ambulatory Visit: Payer: Self-pay | Admitting: Family Medicine

## 2016-09-11 NOTE — Telephone Encounter (Signed)
Please scheduel 30 min office appt for dec or Jan and refill until then

## 2016-09-11 NOTE — Telephone Encounter (Signed)
No recent/future appts., please advise  

## 2016-09-12 NOTE — Telephone Encounter (Signed)
appt scheduled and med refilled 

## 2016-11-02 DIAGNOSIS — B953 Streptococcus pneumoniae as the cause of diseases classified elsewhere: Secondary | ICD-10-CM | POA: Diagnosis not present

## 2016-11-02 DIAGNOSIS — J020 Streptococcal pharyngitis: Secondary | ICD-10-CM | POA: Diagnosis not present

## 2016-11-20 ENCOUNTER — Ambulatory Visit (INDEPENDENT_AMBULATORY_CARE_PROVIDER_SITE_OTHER): Payer: Medicare Other | Admitting: Family Medicine

## 2016-11-20 ENCOUNTER — Encounter: Payer: Self-pay | Admitting: Family Medicine

## 2016-11-20 VITALS — BP 130/76 | HR 88 | Temp 98.4°F | Ht 62.0 in | Wt 153.2 lb

## 2016-11-20 DIAGNOSIS — R69 Illness, unspecified: Secondary | ICD-10-CM | POA: Diagnosis not present

## 2016-11-20 DIAGNOSIS — F17210 Nicotine dependence, cigarettes, uncomplicated: Secondary | ICD-10-CM | POA: Diagnosis not present

## 2016-11-20 DIAGNOSIS — E7800 Pure hypercholesterolemia, unspecified: Secondary | ICD-10-CM | POA: Diagnosis not present

## 2016-11-20 DIAGNOSIS — G8929 Other chronic pain: Secondary | ICD-10-CM

## 2016-11-20 DIAGNOSIS — F418 Other specified anxiety disorders: Secondary | ICD-10-CM

## 2016-11-20 DIAGNOSIS — R7309 Other abnormal glucose: Secondary | ICD-10-CM | POA: Diagnosis not present

## 2016-11-20 DIAGNOSIS — I10 Essential (primary) hypertension: Secondary | ICD-10-CM

## 2016-11-20 DIAGNOSIS — I712 Thoracic aortic aneurysm, without rupture, unspecified: Secondary | ICD-10-CM

## 2016-11-20 DIAGNOSIS — M5440 Lumbago with sciatica, unspecified side: Secondary | ICD-10-CM

## 2016-11-20 LAB — CBC WITH DIFFERENTIAL/PLATELET
BASOS ABS: 0 10*3/uL (ref 0.0–0.1)
Basophils Relative: 0.3 % (ref 0.0–3.0)
EOS PCT: 1.4 % (ref 0.0–5.0)
Eosinophils Absolute: 0.1 10*3/uL (ref 0.0–0.7)
HEMATOCRIT: 48.7 % — AB (ref 36.0–46.0)
Hemoglobin: 16.5 g/dL — ABNORMAL HIGH (ref 12.0–15.0)
LYMPHS ABS: 1.8 10*3/uL (ref 0.7–4.0)
LYMPHS PCT: 20.6 % (ref 12.0–46.0)
MCHC: 33.8 g/dL (ref 30.0–36.0)
MCV: 83.7 fl (ref 78.0–100.0)
MONOS PCT: 5.3 % (ref 3.0–12.0)
Monocytes Absolute: 0.5 10*3/uL (ref 0.1–1.0)
NEUTROS ABS: 6.3 10*3/uL (ref 1.4–7.7)
NEUTROS PCT: 72.4 % (ref 43.0–77.0)
Platelets: 306 10*3/uL (ref 150.0–400.0)
RBC: 5.82 Mil/uL — AB (ref 3.87–5.11)
RDW: 13.8 % (ref 11.5–15.5)
WBC: 8.7 10*3/uL (ref 4.0–10.5)

## 2016-11-20 LAB — LIPID PANEL
CHOL/HDL RATIO: 5
Cholesterol: 201 mg/dL — ABNORMAL HIGH (ref 0–200)
HDL: 40.5 mg/dL (ref >39.00)
LDL CALC: 130 mg/dL — AB (ref 0–99)
NONHDL: 160.64
TRIGLYCERIDES: 153 mg/dL — AB (ref 0.0–149.0)
VLDL: 30.6 mg/dL (ref 0.0–40.0)

## 2016-11-20 LAB — COMPREHENSIVE METABOLIC PANEL
ALK PHOS: 88 U/L (ref 39–117)
ALT: 11 U/L (ref 0–35)
AST: 13 U/L (ref 0–37)
Albumin: 4.2 g/dL (ref 3.5–5.2)
BILIRUBIN TOTAL: 0.5 mg/dL (ref 0.2–1.2)
BUN: 12 mg/dL (ref 6–23)
CALCIUM: 9.8 mg/dL (ref 8.4–10.5)
CO2: 30 meq/L (ref 19–32)
Chloride: 105 mEq/L (ref 96–112)
Creatinine, Ser: 0.81 mg/dL (ref 0.40–1.20)
GFR: 74.17 mL/min (ref >60.00)
GLUCOSE: 114 mg/dL — AB (ref 70–99)
POTASSIUM: 4.7 meq/L (ref 3.5–5.1)
Sodium: 141 mEq/L (ref 135–145)
Total Protein: 7.3 g/dL (ref 6.0–8.3)

## 2016-11-20 LAB — HEMOGLOBIN A1C: Hgb A1c MFr Bld: 5.8 % (ref 4.6–6.5)

## 2016-11-20 LAB — TSH: TSH: 1.09 u[IU]/mL (ref 0.35–4.50)

## 2016-11-20 MED ORDER — AMLODIPINE BESYLATE 10 MG PO TABS: ORAL_TABLET | ORAL | 11 refills | Status: DC

## 2016-11-20 MED ORDER — SERTRALINE HCL 25 MG PO TABS: 25.0000 mg | ORAL_TABLET | Freq: Every day | ORAL | 11 refills | Status: DC

## 2016-11-20 MED ORDER — CYCLOBENZAPRINE HCL 5 MG PO TABS: 5.0000 mg | ORAL_TABLET | Freq: Three times a day (TID) | ORAL | 3 refills | Status: DC | PRN

## 2016-11-20 MED ORDER — TRAMADOL HCL 50 MG PO TABS: 50.0000 mg | ORAL_TABLET | Freq: Four times a day (QID) | ORAL | 2 refills | Status: DC | PRN

## 2016-11-20 NOTE — Assessment & Plan Note (Signed)
Disc in detail risks of smoking and possible outcomes including copd, vascular/ heart disease, cancer , respiratory and sinus infections  Pt voices understanding She smokes a pck every 3 d and is not motivated or ready to quit  Aware this puts her at higher risk of complications of her aortic aneurysm

## 2016-11-20 NOTE — Assessment & Plan Note (Signed)
bp in fair control at this time  BP Readings from Last 1 Encounters:  11/20/16 130/76   No changes needed Disc lifstyle change with low sodium diet and exercise  In fairly good control - this is impt in light of smoking hx and aortic aneurysm Pt takes her bp med as needed-watching this at home and states that her surgeon is aware  I prefer she take it daily  Lab today Enc good diet and exercise habits-doing well with this

## 2016-11-20 NOTE — Assessment & Plan Note (Signed)
No symptoms and pt keeps up with her vascular surgeon  She states they are watching the size and she may need surgery if it gets larger  Enc strongly to take bp med daily and also quit smoking completely  Lab for cholesterol today

## 2016-11-20 NOTE — Assessment & Plan Note (Signed)
Watched by vascular surgery

## 2016-11-20 NOTE — Assessment & Plan Note (Signed)
Due for A1C Exercising regularly  Watching diet  Wt is stable

## 2016-11-20 NOTE — Assessment & Plan Note (Signed)
Doing fairly well with zoloft and occ xanax  Still gets irritable at times Smoking less Exercising Reviewed stressors/ coping techniques/symptoms/ support sources/ tx options and side effects in detail today  zoloft refilled today  Enc good self care

## 2016-11-20 NOTE — Patient Instructions (Addendum)
Labs today  Medicines refilled   Take care of yourself  Keep thinking about quitting smoking   Glad you are doing well

## 2016-11-20 NOTE — Assessment & Plan Note (Addendum)
Intermittent-rad to hip (s) Has tramadol and flexeril prn - does not use often

## 2016-11-20 NOTE — Progress Notes (Signed)
Pre visit review using our clinic review tool, if applicable. No additional management support is needed unless otherwise documented below in the visit note. 

## 2016-11-20 NOTE — Progress Notes (Signed)
Subjective:    Patient ID: Ann Wu, female    DOB: February 02, 1946, 70 y.o.   MRN: [id number]  HPI Here for f/u of chronic medical problems   Feeling good   Wt Readings from Last 3 Encounters:  11/20/16 153 lb 4 oz (69.5 kg)  11/29/15 151 lb (68.5 kg)  12/20/14 145 lb 12 oz (66.1 kg)   bmi 28.0  bp is stable today - even though she has not had bp med in a week - forgot to take it  She honestly does not like how it makes her feel  " know when I need it" - she is honest with her vasc surgeon about that  No cp or palpitations or headaches or edema  No side effects to medicines  BP Readings from Last 3 Encounters:  11/20/16 130/76  11/29/15 (!) 148/70  12/20/14 (!) 146/68    On amlodipine    Has thoracic aortic aneurysm- it is close to being operable   Smoking status - occasional- once in a while  A pack will last her 3 day  Not ready to quit yet - has done patches and e cig   Copd -ok/ breathing is good   Hx of hyperlipidemia Lab Results  Component Value Date   CHOL 204 (H) 12/20/2014   HDL 38.20 (L) 12/20/2014   LDLCALC 138 (H) 12/20/2014   LDLDIRECT 166.0 07/25/2012   TRIG 138.0 12/20/2014   CHOLHDL 5 12/20/2014   Hx of hyperglycemia Lab Results  Component Value Date   HGBA1C 5.9 03/09/2014  due for labs    Hx of depression with anxiety  On zoloft Xanax prn - last refill was feb  Doing ok overall   Very seldom uses tramadol when back/ hip bother her  Still occ does see an accup  Enc pt to return for a health mt exam to review her health mt - she declines currently   Patient Active Problem List   Diagnosis Date Noted  . Acute bronchitis with bronchospasm 11/29/2015  . Anxiety attack 12/20/2014  . Depression with anxiety 10/09/2011  . Hypertension 09/07/2011  . Other screening mammogram 09/07/2011  . Thoracic aortic aneurysm ([place])   . Aneurysm of aorta ([place]) 08/07/2011  . Back pain 07/24/2011  . HYPERKALEMIA 09/04/2010  . 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 HYPERGLYCEMIA 09/04/2010  . COLONIC POLYPS 03/31/2009  . Hyperlipidemia 03/24/2007  . Light smoker 03/24/2007  . ALLERGIC RHINITIS 03/24/2007  . COPD 03/24/2007  . FIBROCYSTIC BREAST DISEASE 03/24/2007  . OSTEOPENIA 03/24/2007  . SLEEP APNEA, MILD 03/24/2007   Past Medical History:  Diagnosis Date  . Allergic rhinitis, seasonal    mild  . Bulging lumbar disc 10/99   L 3-4, L 4-5  . COPD (chronic obstructive pulmonary disease) ([place])    likely per CXR  . Dupuytren's contracture of hand   . Gastritis    EGD 03/2005, 11/10  . Hyperlipidemia   . Hypertension 09/07/2011  . Osteopenia   . Osteopenia    dexa 2003, worse 2006, slighty worse 2010  . Smoker   . Thoracic aortic aneurysm ([place]) 8/12   Past Surgical History:  Procedure Laterality Date  . CHOLECYSTECTOMY    . cyst aspirations      x 2- breast  . gyn surgery  09/1999   hysterectomy- fibroids   Social History  Substance Use Topics  . Smoking status: Current Some Day Smoker    Years: 51.00    Types: Cigarettes  .  Smokeless tobacco: Never Used     Comment: occasional cigaretts   . Alcohol use No   Family History  Problem Relation Age of Onset  . Cancer Mother     breast  . Coronary artery disease Mother   . Stomach cancer Father   . Heart attack Mother    Allergies  Allergen Reactions  . Amoxicillin-Pot Clavulanate   . Codeine     REACTION: nausea and vomiting   Current Outpatient Prescriptions on File Prior to Visit  Medication Sig Dispense Refill  . acetaminophen (TYLENOL) 325 MG tablet Take 650 mg by mouth as needed.     . ALPRAZolam (XANAX) 0.5 MG tablet 1 by mouth once daily as needed for severe anxiety 30 tablet 0  . omeprazole (PRILOSEC) 20 MG capsule Take 1 capsule (20 mg total) by mouth daily as needed. 30 capsule 11   No current facility-administered medications on file prior to visit.     Review of Systems Review of Systems  Constitutional: Negative for fever,  appetite change, fatigue and unexpected weight change.  Eyes: Negative for pain and visual disturbance.  Respiratory: Negative for cough and shortness of breath.   Cardiovascular: Negative for cp or palpitations    Gastrointestinal: Negative for nausea, diarrhea and constipation.  Genitourinary: Negative for urgency and frequency.  Skin: Negative for pallor or rash   MSk pos for occ back/hip and knee pain  Neurological: Negative for weakness, light-headedness, numbness and headaches.  Hematological: Negative for adenopathy. Does not bruise/bleed easily.  Psychiatric/Behavioral: Negative for dysphoric mood. The patient is not nervous/anxious.         Objective:   Physical Exam  Constitutional: She appears well-developed and well-nourished. No distress.  Well appearing   HENT:  Head: Normocephalic and atraumatic.  Mouth/Throat: Oropharynx is clear and moist.  Eyes: Conjunctivae and EOM are normal. Pupils are equal, round, and reactive to light.  Neck: Normal range of motion. Neck supple. No JVD present. Carotid bruit is not present. No thyromegaly present.  Cardiovascular: Normal rate, regular rhythm and intact distal pulses.  Exam reveals no gallop.   Murmur heard. Plus one pedal pulses   Pulmonary/Chest: Effort normal and breath sounds normal. No respiratory distress. She has no wheezes. She has no rales.  No crackles  Mildly distant bs Good air exch overall  No wheeze   Abdominal: Soft. Bowel sounds are normal. She exhibits no distension, no abdominal bruit and no mass. There is no tenderness.  Musculoskeletal: She exhibits no edema.  Lymphadenopathy:    She has no cervical adenopathy.  Neurological: She is alert. She has normal reflexes. No cranial nerve deficit. She exhibits normal muscle tone. Coordination normal.  Skin: Skin is warm and dry. No rash noted. No pallor.  Psychiatric: She has a normal mood and affect. Her speech is normal. Thought content normal. Her mood  appears not anxious. Her affect is not blunt and not labile.          Assessment & Plan:   Problem List Items Addressed This Visit      Cardiovascular and Mediastinum   Thoracic aortic aneurysm ([place])    No symptoms and pt keeps up with her vascular surgeon  She states they are watching the size and she may need surgery if it gets larger  Enc strongly to take bp med daily and also quit smoking completely  Lab for cholesterol today      Relevant Medications   metoprolol tartrate (LOPRESSOR) 25 MG tablet  amLODipine (NORVASC) 10 MG tablet   Hypertension - Primary    bp in fair control at this time  BP Readings from Last 1 Encounters:  11/20/16 130/76   No changes needed Disc lifstyle change with low sodium diet and exercise  In fairly good control - this is impt in light of smoking hx and aortic aneurysm Pt takes her bp med as needed-watching this at home and states that her surgeon is aware  I prefer she take it daily  Lab today Enc good diet and exercise habits-doing well with this       Relevant Medications   metoprolol tartrate (LOPRESSOR) 25 MG tablet   amLODipine (NORVASC) 10 MG tablet   Other Relevant Orders   Comprehensive metabolic panel   CBC with Differential/Platelet   TSH   Lipid panel     Other   Light smoker    Disc in detail risks of smoking and possible outcomes including copd, vascular/ heart disease, cancer , respiratory and sinus infections  Pt voices understanding She smokes a pck every 3 d and is not motivated or ready to quit  Aware this puts her at higher risk of complications of her aortic aneurysm      Hyperlipidemia    Disc goals for lipids and reasons to control them Rev labs with pt (last check) Lab today  Diet is good  Hx of aortic aneurysm  Rev low sat fat diet in detail       Relevant Medications   metoprolol tartrate (LOPRESSOR) 25 MG tablet   amLODipine (NORVASC) 10 MG tablet   Other Relevant Orders   Lipid panel    HYPERGLYCEMIA    Due for A1C Exercising regularly  Watching diet  Wt is stable       Relevant Orders   Hemoglobin A1c   Depression with anxiety    Doing fairly well with zoloft and occ xanax  Still gets irritable at times Smoking less Exercising Reviewed stressors/ coping techniques/symptoms/ support sources/ tx options and side effects in detail today  zoloft refilled today  Enc good self care       Back pain    Intermittent-rad to hip (s) Has tramadol and flexeril prn - does not use often      Relevant Medications   traMADol (ULTRAM) 50 MG tablet   cyclobenzaprine (FLEXERIL) 5 MG tablet

## 2016-11-20 NOTE — Assessment & Plan Note (Signed)
Disc goals for lipids and reasons to control them Rev labs with pt (last check) Lab today  Diet is good  Hx of aortic aneurysm  Rev low sat fat diet in detail

## 2016-12-15 DIAGNOSIS — J111 Influenza due to unidentified influenza virus with other respiratory manifestations: Secondary | ICD-10-CM | POA: Diagnosis not present

## 2016-12-15 DIAGNOSIS — J029 Acute pharyngitis, unspecified: Secondary | ICD-10-CM | POA: Diagnosis not present

## 2016-12-20 DIAGNOSIS — B370 Candidal stomatitis: Secondary | ICD-10-CM | POA: Diagnosis not present

## 2016-12-20 DIAGNOSIS — J029 Acute pharyngitis, unspecified: Secondary | ICD-10-CM | POA: Diagnosis not present

## 2017-03-22 DIAGNOSIS — R69 Illness, unspecified: Secondary | ICD-10-CM | POA: Diagnosis not present

## 2017-03-22 DIAGNOSIS — Z720 Tobacco use: Secondary | ICD-10-CM | POA: Diagnosis not present

## 2017-03-22 DIAGNOSIS — E785 Hyperlipidemia, unspecified: Secondary | ICD-10-CM | POA: Diagnosis not present

## 2017-03-22 DIAGNOSIS — I7410 Embolism and thrombosis of unspecified parts of aorta: Secondary | ICD-10-CM | POA: Diagnosis not present

## 2017-03-22 DIAGNOSIS — I712 Thoracic aortic aneurysm, without rupture: Secondary | ICD-10-CM | POA: Diagnosis not present

## 2017-04-11 ENCOUNTER — Ambulatory Visit (INDEPENDENT_AMBULATORY_CARE_PROVIDER_SITE_OTHER): Payer: Medicare HMO

## 2017-04-11 ENCOUNTER — Ambulatory Visit
Admission: EM | Admit: 2017-04-11 | Discharge: 2017-04-11 | Disposition: A | Discharge status: Home / Self Care | Payer: Medicare HMO | Attending: Family Medicine | Admitting: Family Medicine

## 2017-04-11 DIAGNOSIS — S99812A Other specified injuries of left ankle, initial encounter: Secondary | ICD-10-CM | POA: Diagnosis not present

## 2017-04-11 DIAGNOSIS — S8991XA Unspecified injury of right lower leg, initial encounter: Secondary | ICD-10-CM

## 2017-04-11 DIAGNOSIS — S92255A Nondisplaced fracture of navicular [scaphoid] of left foot, initial encounter for closed fracture: Secondary | ICD-10-CM

## 2017-04-11 DIAGNOSIS — S93402A Sprain of unspecified ligament of left ankle, initial encounter: Secondary | ICD-10-CM | POA: Diagnosis not present

## 2017-04-11 DIAGNOSIS — M25572 Pain in left ankle and joints of left foot: Secondary | ICD-10-CM | POA: Diagnosis not present

## 2017-04-11 DIAGNOSIS — M25561 Pain in right knee: Secondary | ICD-10-CM | POA: Diagnosis not present

## 2017-04-11 DIAGNOSIS — S92255G Nondisplaced fracture of navicular [scaphoid] of left foot, subsequent encounter for fracture with delayed healing: Secondary | ICD-10-CM | POA: Diagnosis not present

## 2017-04-11 MED ORDER — KETOROLAC TROMETHAMINE 60 MG/2ML IM SOLN
60.0000 mg | Freq: Once | INTRAMUSCULAR | Status: AC
  Administered 2017-04-11: 60 mg via INTRAMUSCULAR
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
## 2017-04-11 NOTE — ED Triage Notes (Addendum)
Pt missed a step yesterday and twisted her left ankle/foot and right knee. Pain 4/10 with limping gait. Swelling and bruising noted to left foot.

## 2017-05-01 ENCOUNTER — Encounter: Payer: Self-pay | Admitting: Family Medicine

## 2017-05-01 ENCOUNTER — Ambulatory Visit (INDEPENDENT_AMBULATORY_CARE_PROVIDER_SITE_OTHER)
Admission: RE | Admit: 2017-05-01 | Discharge: 2017-05-01 | Disposition: A | Discharge status: Home / Self Care | Payer: Medicare HMO | Source: Ambulatory Visit | Attending: Family Medicine | Admitting: Family Medicine

## 2017-05-01 ENCOUNTER — Ambulatory Visit (INDEPENDENT_AMBULATORY_CARE_PROVIDER_SITE_OTHER): Payer: Medicare HMO | Admitting: Family Medicine

## 2017-05-01 VITALS — BP 124/62 | HR 72 | Temp 98.3°F | Ht 62.0 in | Wt 147.5 lb

## 2017-05-01 DIAGNOSIS — I712 Thoracic aortic aneurysm, without rupture, unspecified: Secondary | ICD-10-CM

## 2017-05-01 DIAGNOSIS — S92252D Displaced fracture of navicular [scaphoid] of left foot, subsequent encounter for fracture with routine healing: Secondary | ICD-10-CM | POA: Diagnosis not present

## 2017-05-01 DIAGNOSIS — S8391XS Sprain of unspecified site of right knee, sequela: Secondary | ICD-10-CM | POA: Diagnosis not present

## 2017-05-01 DIAGNOSIS — S92202A Fracture of unspecified tarsal bone(s) of left foot, initial encounter for closed fracture: Secondary | ICD-10-CM | POA: Diagnosis not present

## 2017-05-01 DIAGNOSIS — J449 Chronic obstructive pulmonary disease, unspecified: Secondary | ICD-10-CM

## 2017-05-01 DIAGNOSIS — S92253A Displaced fracture of navicular [scaphoid] of unspecified foot, initial encounter for closed fracture: Secondary | ICD-10-CM | POA: Insufficient documentation

## 2017-05-01 DIAGNOSIS — S8391XA Sprain of unspecified site of right knee, initial encounter: Secondary | ICD-10-CM | POA: Insufficient documentation

## 2017-05-01 NOTE — Assessment & Plan Note (Signed)
Pt continues vascular f/u  Still under 5 cm and obs  No symptoms

## 2017-05-01 NOTE — Progress Notes (Signed)
Subjective:    Patient ID: Ann Wu, female    DOB: February 19, 1946, 71 y.o.   MRN: [id number]  HPI Here for f/u of visit to [facility] from 04/11/17 She has a known hx of smoking and osteopenia   She presented after slipping and injurying her R knee and L ankle  She states her L foot rolled out and her R knee twisted  She put ice on it and went to UC the next way   Dg Ankle Complete Left  Result Date: 04/11/2017 CLINICAL DATA:  Left ankle pain following fall, initial encounter EXAM: LEFT ANKLE COMPLETE - 3+ VIEW COMPARISON:  None. FINDINGS: Mild soft tissue swelling is noted laterally. A small bony density is noted adjacent to the navicular bone dorsally which may represent a small avulsion fracture. No other focal abnormality is noted. Mild calcaneal spurring is noted. IMPRESSION: Likely small avulsion from the dorsal aspect of the navicular bone. Lateral soft tissue swelling is noted as well. Electronically Signed   By: Inez Catalina M.D.   On: 04/11/2017 12:58   Dg Knee Complete 4 Views Right  Result Date: 04/11/2017 CLINICAL DATA:  Recent fall with right knee pain, initial encounter EXAM: RIGHT KNEE - COMPLETE 4+ VIEW COMPARISON:  None. FINDINGS: Very minimal medial joint space narrowing is seen. No acute fracture or dislocation is noted. Mild soft tissue swelling is noted over the patella. No significant joint effusion is seen. IMPRESSION: Mild soft tissue swelling without acute bony abnormality. Electronically Signed   By: Inez Catalina M.D.   On: 04/11/2017 12:59   She was diagnosed with small avulsion fracture of the dorsal navicular bone of the left ankle   Knee xray showed soft tissue swelling with no bony abn   She was treated with a toradol injection  Given Cam boot Recommended use of aleve and tramadol at home  Knee is feeling a lot better - and not swollen   Never had any period of non weight bearing  The cam walker helped much  Does not wear at night (this  week)  Ankle/foot is still still swollen   She takes aleve occas  Both knees are sore from gait being off   She is not taking ca and D   Patient Active Problem List   Diagnosis Date Noted  . Right knee sprain 05/01/2017  . Avulsion fracture of navicular bone of foot 05/01/2017  . Anxiety attack 12/20/2014  . Depression with anxiety 10/09/2011  . Hypertension 09/07/2011  . Other screening mammogram 09/07/2011  . Thoracic aortic aneurysm ([place])   . Aneurysm of aorta ([place]) 08/07/2011  . Back pain 07/24/2011  . HYPERKALEMIA 09/04/2010  . 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 HYPERGLYCEMIA 09/04/2010  . COLONIC POLYPS 03/31/2009  . Hyperlipidemia 03/24/2007  . Light smoker 03/24/2007  . ALLERGIC RHINITIS 03/24/2007  . COPD 03/24/2007  . FIBROCYSTIC BREAST DISEASE 03/24/2007  . OSTEOPENIA 03/24/2007  . SLEEP APNEA, MILD 03/24/2007   Past Medical History:  Diagnosis Date  . Allergic rhinitis, seasonal    mild  . Bulging lumbar disc 10/99   L 3-4, L 4-5  . COPD (chronic obstructive pulmonary disease) ([place])    likely per CXR  . Dupuytren's contracture of hand   . Gastritis    EGD 03/2005, 11/10  . Hyperlipidemia   . Hypertension 09/07/2011  . Osteopenia   . Osteopenia    dexa 2003, worse 2006, slighty worse 2010  . Smoker   . Thoracic aortic  aneurysm ([place]) 8/12   Past Surgical History:  Procedure Laterality Date  . CHOLECYSTECTOMY    . cyst aspirations      x 2- breast  . gyn surgery  09/1999   hysterectomy- fibroids   Social History  Substance Use Topics  . Smoking status: Current Some Day Smoker    Packs/day: 1.00    Years: 51.00    Types: Cigarettes  . Smokeless tobacco: Never Used     Comment: occasional cigaretts   . Alcohol use No   Family History  Problem Relation Age of Onset  . Cancer Mother        breast  . Coronary artery disease Mother   . Heart attack Mother   . Stomach cancer Father    Allergies  Allergen Reactions  . Amoxicillin-Pot  Clavulanate   . Codeine     REACTION: nausea and vomiting   Current Outpatient Prescriptions on File Prior to Visit  Medication Sig Dispense Refill  . acetaminophen (TYLENOL) 325 MG tablet Take 650 mg by mouth as needed.     . ALPRAZolam (XANAX) 0.5 MG tablet 1 by mouth once daily as needed for severe anxiety 30 tablet 0  . amLODipine (NORVASC) 10 MG tablet Take 1 tablet (10 mg total) by mouth daily. 30 tablet 11  . cyclobenzaprine (FLEXERIL) 5 MG tablet Take 1 tablet (5 mg total) by mouth 3 (three) times daily as needed. 30 tablet 3  . metoprolol tartrate (LOPRESSOR) 25 MG tablet Take 25 mg by mouth daily.    Marland Kitchen omeprazole (PRILOSEC) 20 MG capsule Take 1 capsule (20 mg total) by mouth daily as needed. 30 capsule 11  . sertraline (ZOLOFT) 25 MG tablet Take 1 tablet (25 mg total) by mouth daily. 30 tablet 11  . traMADol (ULTRAM) 50 MG tablet Take 1 tablet (50 mg total) by mouth every 6 (six) hours as needed. 30 tablet 2   No current facility-administered medications on file prior to visit.     Review of Systems Review of Systems  Constitutional: Negative for fever, appetite change, fatigue and unexpected weight change.  Eyes: Negative for pain and visual disturbance.  Respiratory: Negative for cough and shortness of breath.   Cardiovascular: Negative for cp or palpitations    Gastrointestinal: Negative for nausea, diarrhea and constipation.  Genitourinary: Negative for urgency and frequency.  Skin: Negative for pallor or rash   MSK pos for improved R knee and L foot pain  Neurological: Negative for weakness, light-headedness, numbness and headaches.  Hematological: Negative for adenopathy. Does not bruise/bleed easily.  Psychiatric/Behavioral: Negative for dysphoric mood. The patient is not nervous/anxious.         Objective:   Physical Exam  Constitutional: She appears well-developed and well-nourished. No distress.  Well appearing   HENT:  Head: Normocephalic and atraumatic.    Neck: Normal range of motion. Neck supple.  Cardiovascular: Normal rate and regular rhythm.   Pulmonary/Chest: Effort normal and breath sounds normal.  Diffusely distant bs Scant occ wheezes   Musculoskeletal:       Right knee: She exhibits no deformity, no erythema, normal patellar mobility and normal meniscus.       Left foot: There is normal capillary refill and no deformity.  R knee-nl rom without tenderness or effusion or soft tissue swelling Neg mc murray test  Stable on drawer and lachman Nl gait   L foot- mild swelling over dorsal midfoot with old ecchymosis (corresp to area of fracture) Very mild tenderness  Some pain on eversion and full plantar flexion No crepitus  No effusion Walks well with cam walker    Lymphadenopathy:    She has no cervical adenopathy.  Neurological: She is alert. She displays atrophy. No cranial nerve deficit or sensory deficit. She exhibits normal muscle tone. Coordination normal.  Mild atrophy of L calf muscle   Skin: Skin is warm and dry. No rash noted. No erythema. No pallor.  Psychiatric: She has a normal mood and affect.          Assessment & Plan:   Problem List Items Addressed This Visit      Musculoskeletal and Integument   Avulsion fracture of navicular bone of foot    Reviewed UC notes in detail with pt as well as xrays  Great clinical improvement  Unfortunately pt continues to smoke and has osteopenia  Did adv to start ca and D  Some calf atrophy on exam from rest and cam walker Xray today  Based on result will advise further  Will need exercise/PT to regain som muscle Fall prev discussed      Relevant Orders   DG Ankle Complete Left   Right knee sprain    Much improvement  Based on nl exam index of susp for meniscal or other injury is low  Continue ice prn if pain and update if no further improvement

## 2017-05-01 NOTE — Assessment & Plan Note (Signed)
No changes In a smoker

## 2017-05-01 NOTE — Assessment & Plan Note (Signed)
Vascular continues to monitor  Less than 5 cm  She continues to smoke unfortunately

## 2017-05-01 NOTE — Assessment & Plan Note (Signed)
Reviewed UC notes in detail with pt as well as xrays  Great clinical improvement  Unfortunately pt continues to smoke and has osteopenia  Did adv to start ca and D  Some calf atrophy on exam from rest and cam walker Xray today  Based on result will advise further  Will need exercise/PT to regain som muscle Fall prev discussed

## 2017-05-01 NOTE — Assessment & Plan Note (Signed)
Much improvement  Based on nl exam index of susp for meniscal or other injury is low  Continue ice prn if pain and update if no further improvement

## 2017-05-01 NOTE — Patient Instructions (Addendum)
Try to get 1200-1500 mg of calcium per day with at least 1000 iu of vitamin D - for bone health Continue your aleve when you need Ice when you need it  Keep wearing the boot until you hear from [place]

## 2017-05-07 ENCOUNTER — Telehealth: Payer: Self-pay | Admitting: Family Medicine

## 2017-05-07 NOTE — Telephone Encounter (Signed)
Called the patient informed of PCP instructions.  She agreed to do and stated she has been taking off at night some to sleep already.  Will certainly keep PCP posted.

## 2017-05-07 NOTE — Telephone Encounter (Signed)
Please let pt know that I spoke to the sport med doctor and he reviewed her xray  Standard protocol is the cam boot for 4-6 weeks (she will be coming up on 4 weeks soon) and since her pain is minimal I think it will be ok at the 4 week mark to try some time out of the boot   Let me know how it goes  If pain worsens I want to have her f/u with Dr Lorelei Pont   If her leg or ankle feel excessively weak we will set her up with physical therapy   Keep me posted

## 2017-06-05 DIAGNOSIS — L720 Epidermal cyst: Secondary | ICD-10-CM | POA: Diagnosis not present

## 2017-06-05 DIAGNOSIS — L570 Actinic keratosis: Secondary | ICD-10-CM | POA: Diagnosis not present

## 2017-09-02 ENCOUNTER — Telehealth: Payer: Self-pay

## 2017-09-02 NOTE — Telephone Encounter (Signed)
Clarise Cruz nurse with [employer] left v/m requesting cb about status of fax sent 06/27/17 about pt have bone density post fx and then get bone density q 2 yrs. Also refaxed info today. Do not see under media tab.

## 2017-09-03 NOTE — Telephone Encounter (Signed)
Called pt and she declined to get a DEXA, she said that the fx happened because of a miss step off her porch. I advise pt that her insurance is requesting her to have one but pt again declined she said she's "old" and she's not doing anymore screening suff like this.

## 2017-09-03 NOTE — Telephone Encounter (Signed)
Please let pt know that her insurance wants her to get a dexa - it is long enough from her injury it should be fine Would she like to do it?

## 2017-09-03 NOTE — Telephone Encounter (Signed)
Ok-please let the [employer] nurse know this and have them mark her chart so they do not call about it again  Thanks

## 2017-09-04 NOTE — Telephone Encounter (Signed)
Left voicemail for Clarise Cruz, RN with [facility] letting her know pt declined to proceed with DEXA

## 2017-09-12 ENCOUNTER — Other Ambulatory Visit: Payer: Self-pay | Admitting: Family Medicine

## 2017-09-12 NOTE — Telephone Encounter (Signed)
Px written for call in   

## 2017-09-12 NOTE — Telephone Encounter (Signed)
Rx called in as prescribed 

## 2017-09-12 NOTE — Telephone Encounter (Signed)
Pt had an UC f/u on 05/01/17, last filled on 02/06/16 #30 tab with 0 refills, please advise

## 2017-10-02 DIAGNOSIS — H18212 Corneal edema secondary to contact lens, left eye: Secondary | ICD-10-CM | POA: Diagnosis not present

## 2017-10-14 DIAGNOSIS — H10013 Acute follicular conjunctivitis, bilateral: Secondary | ICD-10-CM | POA: Diagnosis not present

## 2017-10-14 DIAGNOSIS — H25813 Combined forms of age-related cataract, bilateral: Secondary | ICD-10-CM | POA: Diagnosis not present

## 2017-10-17 DIAGNOSIS — J309 Allergic rhinitis, unspecified: Secondary | ICD-10-CM | POA: Diagnosis not present

## 2017-10-17 DIAGNOSIS — H93A9 Pulsatile tinnitus, unspecified ear: Secondary | ICD-10-CM | POA: Diagnosis not present

## 2017-10-17 DIAGNOSIS — H1045 Other chronic allergic conjunctivitis: Secondary | ICD-10-CM | POA: Diagnosis not present

## 2017-10-17 DIAGNOSIS — H903 Sensorineural hearing loss, bilateral: Secondary | ICD-10-CM | POA: Diagnosis not present

## 2017-10-17 DIAGNOSIS — H6122 Impacted cerumen, left ear: Secondary | ICD-10-CM | POA: Diagnosis not present

## 2017-10-17 DIAGNOSIS — H9319 Tinnitus, unspecified ear: Secondary | ICD-10-CM | POA: Diagnosis not present

## 2017-10-25 ENCOUNTER — Encounter (INDEPENDENT_AMBULATORY_CARE_PROVIDER_SITE_OTHER): Payer: Self-pay

## 2017-10-25 ENCOUNTER — Other Ambulatory Visit (INDEPENDENT_AMBULATORY_CARE_PROVIDER_SITE_OTHER): Payer: Self-pay | Admitting: Unknown Physician Specialty

## 2017-10-25 ENCOUNTER — Ambulatory Visit (INDEPENDENT_AMBULATORY_CARE_PROVIDER_SITE_OTHER): Payer: Medicare HMO

## 2017-10-25 DIAGNOSIS — R0989 Other specified symptoms and signs involving the circulatory and respiratory systems: Secondary | ICD-10-CM | POA: Diagnosis not present

## 2017-10-28 DIAGNOSIS — J209 Acute bronchitis, unspecified: Secondary | ICD-10-CM | POA: Diagnosis not present

## 2017-10-28 DIAGNOSIS — J029 Acute pharyngitis, unspecified: Secondary | ICD-10-CM | POA: Diagnosis not present

## 2017-10-29 ENCOUNTER — Telehealth (INDEPENDENT_AMBULATORY_CARE_PROVIDER_SITE_OTHER): Payer: Self-pay | Admitting: Vascular Surgery

## 2017-10-29 NOTE — Telephone Encounter (Signed)
New Message  Beth from ENT verbalized they do not have the patients results and the pt called their office looking for results.  Pts visit was on 11.16.18 with referring MD's name.  Please f/u with office

## 2017-10-30 NOTE — Telephone Encounter (Signed)
Sent results by fax this a.m. Patient was informed to follow up with [place] if she has further questions.

## 2017-11-19 ENCOUNTER — Encounter (INDEPENDENT_AMBULATORY_CARE_PROVIDER_SITE_OTHER): Payer: Medicare HMO | Admitting: Vascular Surgery

## 2017-11-19 ENCOUNTER — Ambulatory Visit (INDEPENDENT_AMBULATORY_CARE_PROVIDER_SITE_OTHER): Payer: Medicare HMO | Admitting: Vascular Surgery

## 2017-11-19 ENCOUNTER — Encounter (INDEPENDENT_AMBULATORY_CARE_PROVIDER_SITE_OTHER): Payer: Self-pay | Admitting: Vascular Surgery

## 2017-11-19 VITALS — BP 148/88 | HR 94 | Resp 17 | Ht 62.0 in | Wt 157.0 lb

## 2017-11-19 DIAGNOSIS — I712 Thoracic aortic aneurysm, without rupture, unspecified: Secondary | ICD-10-CM

## 2017-11-19 DIAGNOSIS — I6523 Occlusion and stenosis of bilateral carotid arteries: Secondary | ICD-10-CM | POA: Diagnosis not present

## 2017-11-19 DIAGNOSIS — I10 Essential (primary) hypertension: Secondary | ICD-10-CM

## 2017-11-19 DIAGNOSIS — I739 Peripheral vascular disease, unspecified: Secondary | ICD-10-CM

## 2017-11-19 DIAGNOSIS — I771 Stricture of artery: Secondary | ICD-10-CM | POA: Diagnosis not present

## 2017-11-19 DIAGNOSIS — I779 Disorder of arteries and arterioles, unspecified: Secondary | ICD-10-CM | POA: Insufficient documentation

## 2017-11-19 NOTE — Assessment & Plan Note (Signed)
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  The right subclavian artery was on the lower end of that range in the left subclavian artery was on the higher end of that range.  At this point, this is below the threshold for intervention in either the subclavian or carotid arteries.  As long as her subclavian arteries are asymptomatic, we will likely not intervene on these.  I would recommend aspirin therapy daily.  I would recommend a repeat noninvasive study in 6 months and if this is stable we can likely follow it on an annual basis going forward.

## 2017-11-19 NOTE — Patient Instructions (Signed)
Carotid Artery Disease The carotid arteries are the two main arteries on either side of the neck that supply blood to the brain. Carotid artery disease, also called carotid artery stenosis, is the narrowing or blockage of one or both carotid arteries. Carotid artery disease increases your risk for a stroke or a transient ischemic attack (TIA). A TIA is an episode in which a waxy, fatty substance that accumulates within the artery (plaque) blocks blood flow to the brain. A TIA is considered a "warning stroke." What are the causes?  Buildup of plaque inside the carotid arteries (atherosclerosis) (common).  A weakened outpouching in an artery (aneurysm).  Inflammation of the carotid artery (arteritis).  A fibrous growth within the carotid artery (fibromuscular dysplasia).  Tissue death within the carotid artery due to radiation treatment (post-radiation necrosis).  Decreased blood flow due to spasms of the carotid artery (vasospasm).  Separation of the walls of the carotid artery (carotid dissection). What increases the risk?  High cholesterol (dyslipidemia).  High blood pressure (hypertension).  Smoking.  Obesity.  Diabetes.  Family history of cardiovascular disease.  Inactivity or lack of regular exercise.  Being female. Men have an increased risk of developing atherosclerosis earlier in life than women. What are the signs or symptoms? Carotid artery disease does not cause symptoms. How is this diagnosed? Diagnosis of carotid artery disease may include:  A physical exam. Your health care provider may hear an abnormal sound (bruit) when listening to the carotid arteries.  Specific tests that look at the blood flow in the carotid arteries. These tests include: ? Carotid artery ultrasonography. ? Carotid or cerebral angiography. ? Computerized tomographic angiography (CTA). ? Magnetic resonance angiography (MRA).  How is this treated? Treatment of carotid artery disease can  include a combination of treatments. Treatment options include:  Surgery. You may have: ? A carotid endarterectomy. This is a surgery to remove the blockages in the carotid arteries. ? A carotid angioplasty with stenting. This is a nonsurgical interventional procedure. A wire mesh (stent) is used to widen the blocked carotid arteries.  Medicines to control blood pressure, cholesterol, and reduce blood clotting (antiplatelet therapy).  Adjusting your diet.  Lifestyle changes such as: ? Quitting smoking. ? Exercising as tolerated or as directed by your health care provider. ? Controlling and maintaining a good blood pressure. ? Keeping cholesterol levels under control.  Follow these instructions at home:  Take medicines only as directed by your health care provider. Make sure you understand all your medicine instructions. Do not stop your medicines without talking to your health care provider.  Follow your health care provider's diet instructions. It is important to eat a healthy diet that is low in saturated fats and includes plenty of fresh fruits, vegetables, and lean meats. High-fat, high-sodium foods as well as foods that are fried, overly processed, or have poor nutritional value should be avoided.  Maintain a healthy weight.  Stay physically active. It is recommended that you get at least 30 minutes of activity every day.  Do not use any tobacco products including cigarettes, chewing tobacco, or electronic cigarettes. If you need help quitting, ask your health care provider.  Limit alcohol use to: ? No more than 2 drinks per day for men. ? No more than 1 drink per day for nonpregnant women.  Do not use illegal drugs.  Keep all follow-up visits as directed by your health care provider. Get help right away if: You develop TIA or stroke symptoms. These include:  Sudden   weakness or numbness on one side of the body, such as in the face, arm, or leg.  Sudden  confusion.  Trouble speaking (aphasia) or understanding.  Sudden trouble seeing out of one or both eyes.  Sudden trouble walking.  Dizziness or feeling like you might faint.  Loss of balance or coordination.  Sudden severe headache with no known cause.  Sudden trouble swallowing (dysphagia).  If you have any of these symptoms, call your local emergency services (911 in [place] Do not drive yourself to the clinic or hospital. This is a medical emergency. This information is not intended to replace advice given to you by your health care provider. Make sure you discuss any questions you have with your health care provider. Document Released: 02/18/2012 Document Revised: 05/03/2016 Document Reviewed: 05/27/2013 Elsevier Interactive Patient Education  2017 [employer]  

## 2017-11-19 NOTE — Assessment & Plan Note (Signed)
Followed by Dr. Ysidro Evert at [facility].  Most recent reading was 4.8 cm in maximal diameter.

## 2017-11-19 NOTE — Progress Notes (Signed)
Patient ID: Ann Wu, female   DOB: [birth year], 71 y.o.   MRN: [id number]  Chief Complaint  Patient presents with  . New Patient (Initial Visit)    ref Tami Ribas for carotid    HPI Ann Wu is a 71 y.o. female.  I am asked to see the patient by Dr. Tami Ribas for evaluation of carotid artery and subclavian artery stenosis.  The patient reports about a month ago she had what sounds like pulsatile tinnitus.  This has subsided after upper respiratory infection, but for about 2 weeks it was very bothersome.  She has a previous history of vascular disease and is followed by a cardiothoracic surgeon at [facility] for a thoracic aortic aneurysm which measures 4.8 cm in maximal diameter.  She has no focal neurologic symptoms. Specifically, the patient denies amaurosis fugax, speech or swallowing difficulties, or arm or leg weakness or numbness.  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  The right subclavian artery was on the lower end of that range in the left subclavian artery was on the higher end of that range.   Past Medical History:  Diagnosis Date  . Allergic rhinitis, seasonal    mild  . Bulging lumbar disc 10/99   L 3-4, L 4-5  . COPD (chronic obstructive pulmonary disease) ([place])    likely per CXR  . Dupuytren's contracture of hand   . Gastritis    EGD 03/2005, 11/10  . Hyperlipidemia   . Hypertension 09/07/2011  . Osteopenia   . Osteopenia    dexa 2003, worse 2006, slighty worse 2010  . Smoker   . Thoracic aortic aneurysm ([place]) 8/12    Past Surgical History:  Procedure Laterality Date  . CHOLECYSTECTOMY    . cyst aspirations      x 2- breast  . gyn surgery  09/1999   hysterectomy- fibroids    Family History  Problem Relation Age of Onset  . Cancer Mother        breast  . Coronary artery disease Mother   . Heart attack Mother   .  Stomach cancer Father   No bleeding or clotting disorders   Social History Social History   Tobacco Use  . Smoking status: Current Some Day Smoker    Packs/day: 1.00    Years: 51.00    Pack years: 51.00    Types: Cigarettes  . Smokeless tobacco: Never Used  . Tobacco comment: occasional cigaretts   Substance Use Topics  . Alcohol use: No    Alcohol/week: 0.0 oz  . Drug use: No     Allergies  Allergen Reactions  . Statins Other (See Comments)  . Amoxicillin-Pot Clavulanate   . Codeine     REACTION: nausea and vomiting    Current Outpatient Medications  Medication Sig Dispense Refill  . acetaminophen (TYLENOL) 325 MG tablet Take 650 mg by mouth as needed.     . ALPRAZolam (XANAX) 0.5 MG tablet TAKE ONE TABLET DAILY AS NEEDED FOR ANXIETY. 30 tablet 0  . amLODipine (NORVASC) 10 MG tablet Take 1 tablet (10 mg total) by mouth daily. 30 tablet 11  . cyclobenzaprine (FLEXERIL) 5 MG tablet Take 1 tablet (5 mg total) by mouth 3 (three) times daily as needed. 30 tablet 3  . metoprolol tartrate (LOPRESSOR) 25 MG tablet Take 25 mg by mouth daily.    Marland Kitchen omeprazole (PRILOSEC) 20  MG capsule Take 1 capsule (20 mg total) by mouth daily as needed. 30 capsule 11  . sertraline (ZOLOFT) 25 MG tablet Take 1 tablet (25 mg total) by mouth daily. 30 tablet 11  . traMADol (ULTRAM) 50 MG tablet Take 1 tablet (50 mg total) by mouth every 6 (six) hours as needed. 30 tablet 2   No current facility-administered medications for this visit.       REVIEW OF SYSTEMS (Negative unless checked)  Constitutional: [] Weight loss  [] Fever  [] Chills Cardiac: [] Chest pain   [] Chest pressure   [] Palpitations   [] Shortness of breath when laying flat   [] Shortness of breath at rest   [] Shortness of breath with exertion. Vascular:  [] Pain in legs with walking   [] Pain in legs at rest   [] Pain in legs when laying flat   [] Claudication   [] Pain in feet when walking  [] Pain in feet at rest  [] Pain in feet when laying  flat   [] History of DVT   [] Phlebitis   [] Swelling in legs   [] Varicose veins   [] Non-healing ulcers Pulmonary:   [] Uses home oxygen   [] Productive cough   [] Hemoptysis   [] Wheeze  [] COPD   [] Asthma Neurologic:  [] Dizziness  [] Blackouts   [] Seizures   [] History of stroke   [] History of TIA  [] Aphasia   [] Temporary blindness   [] Dysphagia   [] Weakness or numbness in arms   [] Weakness or numbness in legs Musculoskeletal:  [x] Arthritis   [] Joint swelling   [] Joint pain   [x] Low back pain Hematologic:  [] Easy bruising  [] Easy bleeding   [] Hypercoagulable state   [] Anemic  [] Hepatitis Gastrointestinal:  [] Blood in stool   [] Vomiting blood  [] Gastroesophageal reflux/heartburn   [] Abdominal pain Genitourinary:  [] Chronic kidney disease   [] Difficult urination  [] Frequent urination  [] Burning with urination   [] Hematuria Skin:  [] Rashes   [] Ulcers   [] Wounds Psychological:  [] History of anxiety   []  History of major depression.    Physical Exam BP (!) 148/88 (BP Location: Left Arm)   Pulse 94   Resp 17   Ht 5\' 2"  (1.575 m)   Wt 157 lb (71.2 kg)   BMI 28.72 kg/m  Gen:  WD/WN, NAD Head: [place]/AT, No temporalis wasting.  Ear/Nose/Throat: Hearing grossly intact, nares w/o erythema or drainage, oropharynx w/o Erythema/Exudate Eyes: Conjunctiva clear, sclera non-icteric  Neck: trachea midline.  Bilateral carotid bruits are present Pulmonary:  Good air movement, clear to auscultation bilaterally.  Cardiac: RRR, normal S1, S2 Vascular:  Vessel Right Left  Radial Palpable  1+ palpable                                   Gastrointestinal: soft, non-tender/non-distended.  Musculoskeletal: M/S 5/5 throughout.  Extremities without ischemic changes.  No deformity or atrophy.  No lower extremity edema. Neurologic: Sensation grossly intact in extremities.  Symmetrical.  Speech is fluent. Motor exam as listed above. Psychiatric: Judgment intact, Mood & affect appropriate for pt's clinical  situation. Dermatologic: No rashes or ulcers noted.  No cellulitis or open wounds.    Radiology No results found.  Labs No results found for this or any previous visit (from the past 2160 hour(s)).  Assessment/Plan:  Thoracic aortic aneurysm Followed by Dr. Ysidro Evert at [facility].  Most recent reading was 4.8 cm in maximal diameter.  Hypertension blood pressure control important in reducing the progression of atherosclerotic disease and aneurysmal disease. On  appropriate oral medications.   Carotid arterial disease (HCC) A recently performed carotid duplex suggested 1-39% bilateral carotid artery stenosis as well as elevated velocities in both subclavian arteries consistent with 50-75% stenosis.  The right subclavian artery was on the lower end of that range in the left subclavian artery was on the higher end of that range.  At this point, this is below the threshold for intervention in either the subclavian or carotid arteries.  As long as her subclavian arteries are asymptomatic, we will likely not intervene on these.  I would recommend aspirin therapy daily.  I would recommend a repeat noninvasive study in 6 months and if this is stable we can likely follow it on an annual basis going forward.  Subclavian artery stenosis (HCC) A recently performed carotid duplex suggested 1-39% bilateral carotid artery stenosis as well as elevated velocities in both subclavian arteries consistent with 50-75% stenosis.  The right subclavian artery was on the lower end of that range in the left subclavian artery was on the higher end of that range.  At this point, this is below the threshold for intervention in either the subclavian or carotid arteries.  As long as her subclavian arteries are asymptomatic, we will likely not intervene on these.  I would recommend aspirin therapy daily.  I would recommend a repeat noninvasive study in 6 months and if this is stable we can likely follow it on an annual basis going  forward.      Leotis Pain 11/19/2017, 4:15 PM   This note was created with Dragon medical transcription system.  Any errors from dictation are unintentional.

## 2017-11-19 NOTE — Assessment & Plan Note (Signed)
blood pressure control important in reducing the progression of atherosclerotic disease and aneurysmal disease. On appropriate oral medications.  

## 2018-01-08 ENCOUNTER — Other Ambulatory Visit: Payer: Self-pay | Admitting: Family Medicine

## 2018-01-08 NOTE — Telephone Encounter (Signed)
Please schedule a spring f/u and refill until then 

## 2018-01-08 NOTE — Telephone Encounter (Signed)
appt scheduled and med refilled 

## 2018-01-08 NOTE — Telephone Encounter (Signed)
Last OV was 05/01/17 for UC f/u on foot, no future appts, please advise

## 2018-01-20 DIAGNOSIS — J209 Acute bronchitis, unspecified: Secondary | ICD-10-CM | POA: Diagnosis not present

## 2018-01-20 DIAGNOSIS — J449 Chronic obstructive pulmonary disease, unspecified: Secondary | ICD-10-CM | POA: Diagnosis not present

## 2018-02-25 ENCOUNTER — Encounter: Payer: Self-pay | Admitting: Family Medicine

## 2018-02-25 ENCOUNTER — Ambulatory Visit: Payer: Medicare HMO | Admitting: Family Medicine

## 2018-02-25 VITALS — BP 136/64 | HR 102 | Temp 98.3°F | Ht 62.0 in | Wt 144.2 lb

## 2018-02-25 DIAGNOSIS — R7309 Other abnormal glucose: Secondary | ICD-10-CM | POA: Diagnosis not present

## 2018-02-25 DIAGNOSIS — I6523 Occlusion and stenosis of bilateral carotid arteries: Secondary | ICD-10-CM | POA: Diagnosis not present

## 2018-02-25 DIAGNOSIS — F418 Other specified anxiety disorders: Secondary | ICD-10-CM

## 2018-02-25 DIAGNOSIS — F17210 Nicotine dependence, cigarettes, uncomplicated: Secondary | ICD-10-CM

## 2018-02-25 DIAGNOSIS — I10 Essential (primary) hypertension: Secondary | ICD-10-CM

## 2018-02-25 DIAGNOSIS — Z23 Encounter for immunization: Secondary | ICD-10-CM | POA: Diagnosis not present

## 2018-02-25 DIAGNOSIS — J449 Chronic obstructive pulmonary disease, unspecified: Secondary | ICD-10-CM | POA: Diagnosis not present

## 2018-02-25 DIAGNOSIS — I712 Thoracic aortic aneurysm, without rupture, unspecified: Secondary | ICD-10-CM

## 2018-02-25 DIAGNOSIS — E7800 Pure hypercholesterolemia, unspecified: Secondary | ICD-10-CM | POA: Diagnosis not present

## 2018-02-25 DIAGNOSIS — R69 Illness, unspecified: Secondary | ICD-10-CM | POA: Diagnosis not present

## 2018-02-25 DIAGNOSIS — I771 Stricture of artery: Secondary | ICD-10-CM

## 2018-02-25 LAB — LIPID PANEL
Cholesterol: 196 mg/dL (ref 0–200)
HDL: 41.3 mg/dL (ref >39.00)
LDL Cholesterol: 118 mg/dL — ABNORMAL HIGH (ref 0–99)
NonHDL: 154.87
TRIGLYCERIDES: 183 mg/dL — AB (ref 0.0–149.0)
Total CHOL/HDL Ratio: 5
VLDL: 36.6 mg/dL (ref 0.0–40.0)

## 2018-02-25 LAB — CBC WITH DIFFERENTIAL/PLATELET
Basophils Absolute: 0 10*3/uL (ref 0.0–0.1)
Basophils Relative: 0.5 % (ref 0.0–3.0)
EOS PCT: 1.5 % (ref 0.0–5.0)
Eosinophils Absolute: 0.1 10*3/uL (ref 0.0–0.7)
HCT: 52.6 % — ABNORMAL HIGH (ref 36.0–46.0)
Hemoglobin: 17.5 g/dL — ABNORMAL HIGH (ref 12.0–15.0)
LYMPHS ABS: 1.5 10*3/uL (ref 0.7–4.0)
Lymphocytes Relative: 16.6 % (ref 12.0–46.0)
MCHC: 33.2 g/dL (ref 30.0–36.0)
MCV: 86.1 fl (ref 78.0–100.0)
Monocytes Absolute: 0.5 10*3/uL (ref 0.1–1.0)
Monocytes Relative: 5.9 % (ref 3.0–12.0)
NEUTROS ABS: 6.7 10*3/uL (ref 1.4–7.7)
NEUTROS PCT: 75.5 % (ref 43.0–77.0)
PLATELETS: 306 10*3/uL (ref 150.0–400.0)
RBC: 6.11 Mil/uL — ABNORMAL HIGH (ref 3.87–5.11)
RDW: 13.8 % (ref 11.5–15.5)
WBC: 8.8 10*3/uL (ref 4.0–10.5)

## 2018-02-25 LAB — TSH: TSH: 0.88 u[IU]/mL (ref 0.35–4.50)

## 2018-02-25 LAB — COMPREHENSIVE METABOLIC PANEL
ALK PHOS: 111 U/L (ref 39–117)
ALT: 11 U/L (ref 0–35)
AST: 11 U/L (ref 0–37)
Albumin: 4.3 g/dL (ref 3.5–5.2)
BUN: 11 mg/dL (ref 6–23)
CALCIUM: 10.3 mg/dL (ref 8.4–10.5)
CO2: 30 mEq/L (ref 19–32)
Chloride: 105 mEq/L (ref 96–112)
Creatinine, Ser: 0.79 mg/dL (ref 0.40–1.20)
GFR: 76.07 mL/min (ref >60.00)
GLUCOSE: 111 mg/dL — AB (ref 70–99)
POTASSIUM: 4.6 meq/L (ref 3.5–5.1)
Sodium: 142 mEq/L (ref 135–145)
TOTAL PROTEIN: 7.3 g/dL (ref 6.0–8.3)
Total Bilirubin: 0.6 mg/dL (ref 0.2–1.2)

## 2018-02-25 LAB — HEMOGLOBIN A1C: Hgb A1c MFr Bld: 5.8 % (ref 4.6–6.5)

## 2018-02-25 MED ORDER — OMEPRAZOLE 20 MG PO CPDR: 20.0000 mg | DELAYED_RELEASE_CAPSULE | Freq: Every day | ORAL | 11 refills | Status: DC | PRN

## 2018-02-25 MED ORDER — AMLODIPINE BESYLATE 10 MG PO TABS: ORAL_TABLET | ORAL | 11 refills | Status: DC

## 2018-02-25 MED ORDER — SERTRALINE HCL 25 MG PO TABS: 25.0000 mg | ORAL_TABLET | Freq: Every day | ORAL | 1 refills | Status: DC

## 2018-02-25 MED ORDER — METOPROLOL TARTRATE 25 MG PO TABS: 25.0000 mg | ORAL_TABLET | Freq: Every day | ORAL | 11 refills | Status: DC

## 2018-02-25 NOTE — Patient Instructions (Addendum)
Take care of yourself  Keep thinking about quitting smoking   Pneumonia vaccine today (prevnar)   No change in medicines  Labs today   If you ever want to come back for a physical/ to discuss preventative health care - just make an appt

## 2018-02-25 NOTE — Progress Notes (Signed)
Subjective:    Patient ID: Ann Wu, female    DOB: 1946-02-19, 72 y.o.   MRN: [id number]  HPI  Here for f/u of chronic medical problems   Lot going on in the family  Medical  Son lost his job   She was ordained as a deacon in [employer] Readings from Last 3 Encounters:  02/25/18 144 lb 4 oz (65.4 kg)  11/19/17 157 lb (71.2 kg)  05/01/17 147 lb 8 oz (66.9 kg)  lost weight- happy about that  Has cut back on junk food  Mindful of only eating for hunger  26.38 kg/m  2 sodas per week   Smoking status  She put her cigarettes in the freezer and that slows her down  A pack lasts her 2 days   Copd -breathing is fair  She has had several uris this season/had inhaler and has had xrays ([facility]) Does not use inhaler often   bp is stable today  Hx of HTN in setting of thoracic aortic aneurysm and carotid disease/subclavian art dz Will have f/u soon  No cp or palpitations or headaches or edema  No side effects to medicines  BP Readings from Last 3 Encounters:  02/25/18 136/64  11/19/17 (!) 148/88  05/01/17 124/62   she takes asa daily 81 mg     Lab Results  Component Value Date   CREATININE 0.81 11/20/2016   BUN 12 11/20/2016   NA 141 11/20/2016   K 4.7 11/20/2016   CL 105 11/20/2016   CO2 30 11/20/2016    GERD Takes omeprazole  That has been well controlled   Dep/anx zoloft and xanax Takes xanax if she wakes up jittery and it does not improve  Overall doing fairly well in light of stress   Flu vaccine -11/18  Hyperglycemia Lab Results  Component Value Date   HGBA1C 5.8 11/20/2016    Hyperlipidemia  Lab Results  Component Value Date   CHOL 201 (H) 11/20/2016   HDL 40.50 11/20/2016   LDLCALC 130 (H) 11/20/2016   LDLDIRECT 166.0 07/25/2012   TRIG 153.0 (H) 11/20/2016   CHOLHDL 5 11/20/2016  she cannot take any statins  Diet has been better  She does fry her pork chops   Patient Active Problem List   Diagnosis Date Noted  . Carotid  arterial disease ([place]) 11/19/2017  . Subclavian artery stenosis ([place]) 11/19/2017  . Right knee sprain 05/01/2017  . Avulsion fracture of navicular bone of foot 05/01/2017  . Anxiety attack 12/20/2014  . Depression with anxiety 10/09/2011  . Hypertension 09/07/2011  . Other screening mammogram 09/07/2011  . Thoracic aortic aneurysm ([place])   . Aneurysm of aorta ([place]) 08/07/2011  . Back pain 07/24/2011  . HYPERKALEMIA 09/04/2010  . 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 HYPERGLYCEMIA 09/04/2010  . COLONIC POLYPS 03/31/2009  . Hyperlipidemia 03/24/2007  . Light smoker 03/24/2007  . ALLERGIC RHINITIS 03/24/2007  . COPD (chronic obstructive pulmonary disease) ([place]) 03/24/2007  . FIBROCYSTIC BREAST DISEASE 03/24/2007  . OSTEOPENIA 03/24/2007  . SLEEP APNEA, MILD 03/24/2007   Past Medical History:  Diagnosis Date  . Allergic rhinitis, seasonal    mild  . Bulging lumbar disc 10/99   L 3-4, L 4-5  . COPD (chronic obstructive pulmonary disease) ([place])    likely per CXR  . Dupuytren's contracture of hand   . Gastritis    EGD 03/2005, 11/10  . Hyperlipidemia   . Hypertension 09/07/2011  . Osteopenia   .  Osteopenia    dexa 2003, worse 2006, slighty worse 2010  . Smoker   . Thoracic aortic aneurysm ([place]) 8/12   Past Surgical History:  Procedure Laterality Date  . CHOLECYSTECTOMY    . cyst aspirations      x 2- breast  . gyn surgery  09/1999   hysterectomy- fibroids   Social History   Tobacco Use  . Smoking status: Current Some Day Smoker    Packs/day: 1.00    Years: 51.00    Pack years: 51.00    Types: Cigarettes  . Smokeless tobacco: Never Used  . Tobacco comment: occasional cigaretts   Substance Use Topics  . Alcohol use: No    Alcohol/week: 0.0 oz  . Drug use: No   Family History  Problem Relation Age of Onset  . Cancer Mother        breast  . Coronary artery disease Mother   . Heart attack Mother   . Stomach cancer Father    Allergies  Allergen Reactions  .  Statins Other (See Comments)  . Amoxicillin-Pot Clavulanate   . Codeine     REACTION: nausea and vomiting   Current Outpatient Medications on File Prior to Visit  Medication Sig Dispense Refill  . acetaminophen (TYLENOL) 325 MG tablet Take 650 mg by mouth as needed.     . ALPRAZolam (XANAX) 0.5 MG tablet TAKE ONE TABLET DAILY AS NEEDED FOR ANXIETY. 30 tablet 0  . aspirin EC 81 MG tablet Take 81 mg by mouth daily.    . cyclobenzaprine (FLEXERIL) 5 MG tablet Take 1 tablet (5 mg total) by mouth 3 (three) times daily as needed. 30 tablet 3  . traMADol (ULTRAM) 50 MG tablet Take 1 tablet (50 mg total) by mouth every 6 (six) hours as needed. 30 tablet 2   No current facility-administered medications on file prior to visit.     Review of Systems  Constitutional: Negative for activity change, appetite change, fatigue, fever and unexpected weight change.  HENT: Negative for congestion, ear pain, rhinorrhea, sinus pressure and sore throat.   Eyes: Negative for pain, redness and visual disturbance.  Respiratory: Negative for cough, shortness of breath and wheezing.   Cardiovascular: Negative for chest pain and palpitations.  Gastrointestinal: Negative for abdominal pain, blood in stool, constipation and diarrhea.  Endocrine: Negative for polydipsia and polyuria.  Genitourinary: Negative for dysuria, frequency and urgency.  Musculoskeletal: Negative for arthralgias, back pain and myalgias.  Skin: Negative for pallor and rash.  Allergic/Immunologic: Negative for environmental allergies.  Neurological: Negative for dizziness, syncope and headaches.  Hematological: Negative for adenopathy. Does not bruise/bleed easily.  Psychiatric/Behavioral: Negative for decreased concentration and dysphoric mood. The patient is nervous/anxious.        Pos for stressors       Objective:   Physical Exam  Constitutional: She appears well-developed and well-nourished. No distress.  Well appearing   HENT:    Head: Normocephalic and atraumatic.  Mouth/Throat: Oropharynx is clear and moist.  Eyes: Conjunctivae and EOM are normal. Pupils are equal, round, and reactive to light.  Neck: Normal range of motion. Neck supple. No JVD present. Carotid bruit is present. No thyromegaly present.  Cardiovascular: Normal rate, regular rhythm, normal heart sounds and intact distal pulses. Exam reveals no gallop.  Pulmonary/Chest: Effort normal and breath sounds normal. No respiratory distress. She has no wheezes. She has no rales.  No crackles  Abdominal: Soft. Bowel sounds are normal. She exhibits no distension, no abdominal bruit,  no pulsatile midline mass and no mass. There is no tenderness.  Musculoskeletal: She exhibits no edema.  Lymphadenopathy:    She has no cervical adenopathy.  Neurological: She is alert. She has normal reflexes. No cranial nerve deficit. She exhibits normal muscle tone. Coordination normal.  Skin: Skin is warm and dry. No rash noted. No pallor.  Solar lentigines diffusely   Psychiatric: She has a normal mood and affect.  Mood is good today          Assessment & Plan:   Problem List Items Addressed This Visit      Cardiovascular and Mediastinum   Aneurysm of aorta (HCC)    Continue vascular f/u  No symptoms       Relevant Medications   amLODipine (NORVASC) 10 MG tablet   metoprolol tartrate (LOPRESSOR) 25 MG tablet   aspirin EC 81 MG tablet   Carotid arterial disease (HCC)    Continue vascular f/u  No clinical changes  Continue asa /chol control/bp control  Enc smoking cessation      Relevant Medications   amLODipine (NORVASC) 10 MG tablet   metoprolol tartrate (LOPRESSOR) 25 MG tablet   aspirin EC 81 MG tablet   Hypertension - Primary    bp in fair control at this time  BP Readings from Last 1 Encounters:  02/25/18 136/64   No changes needed Disc lifstyle change with low sodium diet and exercise  Labs today  Urged to consider smoking cessation        Relevant Medications   amLODipine (NORVASC) 10 MG tablet   metoprolol tartrate (LOPRESSOR) 25 MG tablet   aspirin EC 81 MG tablet   Other Relevant Orders   CBC with Differential/Platelet (Completed)   Comprehensive metabolic panel (Completed)   Lipid panel (Completed)   TSH (Completed)   Subclavian artery stenosis (HCC)    Continue vasc f/u  No clinical changes Enc smoking cessation       Relevant Medications   amLODipine (NORVASC) 10 MG tablet   metoprolol tartrate (LOPRESSOR) 25 MG tablet   aspirin EC 81 MG tablet     Respiratory   COPD (chronic obstructive pulmonary disease) (HCC)    No clinical changes  Denies sob or cough Enc to consider quitting smoking-she is not ready        Other   Depression with anxiety    Despite stress doing fairly well  Continues zoloft with occ prn xanax  Reviewed stressors/ coping techniques/symptoms/ support sources/ tx options and side effects in detail today       Relevant Medications   sertraline (ZOLOFT) 25 MG tablet   HYPERGLYCEMIA    Diet has improved and she has lost wt  A1C today disc imp of low glycemic diet and wt loss to prevent DM2       Relevant Orders   Hemoglobin A1c (Completed)   Hyperlipidemia    Disc goals for lipids and reasons to control them Rev labs with pt  (from last check) Lipid panel today  She cannot tolerate any statins  Rev low sat fat diet in detail       Relevant Medications   amLODipine (NORVASC) 10 MG tablet   metoprolol tartrate (LOPRESSOR) 25 MG tablet   aspirin EC 81 MG tablet   Other Relevant Orders   Lipid panel (Completed)   Light smoker    With copd and vasc dz Disc in detail risks of smoking and possible outcomes including copd, vascular/ heart disease, cancer ,  respiratory and sinus infections  Pt voices understanding  Pt is not ready to quit yet but may consider in the near future        Other Visit Diagnoses    Need for vaccination with 13-polyvalent pneumococcal  conjugate vaccine       Relevant Orders   Pneumococcal conjugate vaccine 13-valent (Completed)

## 2018-02-26 NOTE — Assessment & Plan Note (Signed)
No clinical changes  Denies sob or cough Enc to consider quitting smoking-she is not ready

## 2018-02-26 NOTE — Assessment & Plan Note (Signed)
Continue vascular f/u  No clinical changes  Continue asa /chol control/bp control  Enc smoking cessation

## 2018-02-26 NOTE — Assessment & Plan Note (Signed)
Despite stress doing fairly well  Continues zoloft with occ prn xanax  Reviewed stressors/ coping techniques/symptoms/ support sources/ tx options and side effects in detail today

## 2018-02-26 NOTE — Assessment & Plan Note (Signed)
Disc goals for lipids and reasons to control them Rev labs with pt  (from last check) Lipid panel today  She cannot tolerate any statins  Rev low sat fat diet in detail

## 2018-02-26 NOTE — Assessment & Plan Note (Signed)
Continue vascular f/u  No symptoms

## 2018-02-26 NOTE — Assessment & Plan Note (Signed)
Diet has improved and she has lost wt  A1C today disc imp of low glycemic diet and wt loss to prevent DM2

## 2018-02-26 NOTE — Assessment & Plan Note (Signed)
Continue vasc f/u  No clinical changes Enc smoking cessation

## 2018-02-26 NOTE — Assessment & Plan Note (Signed)
With copd and vasc dz Disc in detail risks of smoking and possible outcomes including copd, vascular/ heart disease, cancer , respiratory and sinus infections  Pt voices understanding  Pt is not ready to quit yet but may consider in the near future

## 2018-02-26 NOTE — Assessment & Plan Note (Signed)
bp in fair control at this time  BP Readings from Last 1 Encounters:  02/25/18 136/64   No changes needed Disc lifstyle change with low sodium diet and exercise  Labs today  Urged to consider smoking cessation

## 2018-02-27 ENCOUNTER — Telehealth: Payer: Self-pay | Admitting: Family Medicine

## 2018-02-27 DIAGNOSIS — D582 Other hemoglobinopathies: Secondary | ICD-10-CM | POA: Insufficient documentation

## 2018-02-27 NOTE — Telephone Encounter (Signed)
-----   Message from Tammi Sou, [place] sent at 02/26/2018  1:12 PM EDT ----- Pt notified of lab results and Dr. Marliss Coots comments. Pt is okay with seeing Hematologist. Please put referral in and I advise pt our [facility] will call to schedule appt

## 2018-02-27 NOTE — Telephone Encounter (Signed)
Ref done  Will route to [facility] 

## 2018-02-27 NOTE — Telephone Encounter (Signed)
Working on this , see referral message -Kris Mouton, RMA

## 2018-03-11 ENCOUNTER — Inpatient Hospital Stay: Payer: Medicare HMO | Attending: Internal Medicine | Admitting: Internal Medicine

## 2018-03-11 ENCOUNTER — Encounter: Payer: Self-pay | Admitting: Internal Medicine

## 2018-03-11 ENCOUNTER — Other Ambulatory Visit: Payer: Self-pay

## 2018-03-11 ENCOUNTER — Inpatient Hospital Stay: Payer: Medicare HMO

## 2018-03-11 DIAGNOSIS — R42 Dizziness and giddiness: Secondary | ICD-10-CM | POA: Diagnosis not present

## 2018-03-11 DIAGNOSIS — D751 Secondary polycythemia: Secondary | ICD-10-CM | POA: Diagnosis not present

## 2018-03-11 DIAGNOSIS — M8580 Other specified disorders of bone density and structure, unspecified site: Secondary | ICD-10-CM | POA: Insufficient documentation

## 2018-03-11 DIAGNOSIS — F17210 Nicotine dependence, cigarettes, uncomplicated: Secondary | ICD-10-CM | POA: Diagnosis not present

## 2018-03-11 DIAGNOSIS — R69 Illness, unspecified: Secondary | ICD-10-CM | POA: Diagnosis not present

## 2018-03-11 DIAGNOSIS — I10 Essential (primary) hypertension: Secondary | ICD-10-CM | POA: Diagnosis not present

## 2018-03-11 DIAGNOSIS — J449 Chronic obstructive pulmonary disease, unspecified: Secondary | ICD-10-CM | POA: Diagnosis not present

## 2018-03-11 LAB — LACTATE DEHYDROGENASE: LDH: 154 U/L (ref 98–192)

## 2018-03-11 NOTE — Progress Notes (Signed)
Here for new pt evaluation  Repeat BP at 1527  165/83,  p 92.

## 2018-03-11 NOTE — Assessment & Plan Note (Addendum)
#  Hemoglobin 17 hematocrit 52; with normal white count/platelets.  Etiology includes primary versus secondary-patient smokes/COPD.   #Recommend checking Jak 2 mutation/with reflex; erythropoietin level and LDH.  #I had a long discussion with the patient regarding signs and symptoms of polycythemia; and the treatment option of phlebotomy.  She also understands that phlebotomy is usually help patients who are symptomatic from primary erythrocytosis/polycythemia vera.   # Active smoker; counseled about quitting smoking; patient not interested at this time.  Will discuss LCSP.   #Patient follow-up with me in approximately 2 weeks; possible phlebotomy.  Thank you Dr. Glori Bickers for allowing me to participate in the care of your pleasant patient. Please do not hesitate to contact me with questions or concerns in the interim.

## 2018-03-11 NOTE — Progress Notes (Signed)
[place] CONSULT NOTE  Patient Care Team: Tower, Wynelle Fanny, MD as PCP - General  CHIEF COMPLAINTS/PURPOSE OF CONSULTATION:  Erythrocytosis  #Erythrocytosis [at least since 2015]  #Smoker/COPD  No history exists.     HISTORY OF PRESENTING ILLNESS:  Ann Wu 72 y.o.  female long-standing history of smoking/COPD [not on home O2] has been referred to [place] for further evaluation recommendations for elevated hemoglobin/hematocrit.  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  She also complains of intermittent dizzy spells; but no falls.  She denies any night sweats or fevers.  Does admit to trying to lose weight; lost some weight.  She does complain of fatigue.  She also complains of difficulty sleeping at night.  She denies any pain.  ROS: A complete 10 point review of system is done which is negative except mentioned above in history of present illness  MEDICAL HISTORY:  Past Medical History:  Diagnosis Date  . Allergic rhinitis, seasonal    mild  . Bulging lumbar disc 10/99   L 3-4, L 4-5  . COPD (chronic obstructive pulmonary disease) ([place])    likely per CXR  . Dupuytren's contracture of hand   . Gastritis    EGD 03/2005, 11/10  . Hyperlipidemia   . Hypertension 09/07/2011  . Osteopenia   . Osteopenia    dexa 2003, worse 2006, slighty worse 2010  . Smoker   . Thoracic aortic aneurysm ([place]) 8/12    SURGICAL HISTORY: Past Surgical History:  Procedure Laterality Date  . ABDOMINAL HYSTERECTOMY    . BREAST CYST EXCISION  1980  . CHOLECYSTECTOMY    . cyst aspirations      x 2- breast  . 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 Marital status: Married    Spouse name: Not on file  .  Number of children: 2  . Years of education: Not on file  . Highest education level: Not on file  Occupational History  . Occupation: Retired    Fish farm manager: RETIRED    Comment: Arboriculturist  . Financial resource strain: Not on file  . Food insecurity:    Worry: Not on file    Inability: Not on file  . Transportation needs:    Medical: Not on file    Non-medical: Not on file  Tobacco Use  . Smoking status: Current Some Day Smoker    Packs/day: 1.00    Years: 51.00    Pack years: 51.00    Types: Cigarettes  . Smokeless tobacco: Never Used  . Tobacco comment: occasional cigaretts   Substance and Sexual Activity  . Alcohol use: No    Alcohol/week: 0.0 oz  . Drug use: No  . Sexual activity: Not Currently  Lifestyle  . Physical activity:    Days per week: Not on file    Minutes per session: Not on file  . Stress: Not on file  Relationships  . Social connections:    Talks on phone: Not on file    Gets together: Not on file    Attends religious service: Not on file    Active member of club or organization: Not on file    Attends meetings of clubs or organizations: Not on file  Relationship status: Not on file  . Intimate partner violence:    Fear of current or ex partner: Not on file    Emotionally abused: Not on file    Physically abused: Not on file    Forced sexual activity: Not on file  Other Topics Concern  . Not on file  Social History Narrative   Has twin grandchildren    FAMILY HISTORY: mom- breast cancer- at 30y; dad- Dx- stomach/lung cancer; brother- no cancers.  Family History  Problem Relation Age of Onset  . Cancer Mother        breast  . Coronary artery disease Mother   . Heart attack Mother   . Stomach cancer Father   . Lung cancer Father   . Cancer Maternal Grandmother 49       " blood and lymph nodes " per pt  . Dementia Maternal Grandfather     ALLERGIES:  is allergic to statins; amoxicillin-pot clavulanate; and  codeine.  MEDICATIONS:  Current Outpatient Medications  Medication Sig Dispense Refill  . amLODipine (NORVASC) 10 MG tablet Take 1 tablet (10 mg total) by mouth daily. 30 tablet 11  . aspirin EC 81 MG tablet Take 81 mg by mouth daily.    . metoprolol tartrate (LOPRESSOR) 25 MG tablet Take 1 tablet (25 mg total) by mouth daily. 30 tablet 11  . sertraline (ZOLOFT) 25 MG tablet Take 1 tablet (25 mg total) by mouth daily. 30 tablet 1  . acetaminophen (TYLENOL) 325 MG tablet Take 650 mg by mouth as needed.     . ALPRAZolam (XANAX) 0.5 MG tablet TAKE ONE TABLET DAILY AS NEEDED FOR ANXIETY. (Patient not taking: Reported on 03/11/2018) 30 tablet 0  . cyclobenzaprine (FLEXERIL) 5 MG tablet Take 1 tablet (5 mg total) by mouth 3 (three) times daily as needed. (Patient not taking: Reported on 03/11/2018) 30 tablet 3  . omeprazole (PRILOSEC) 20 MG capsule Take 1 capsule (20 mg total) by mouth daily as needed. (Patient not taking: Reported on 03/11/2018) 30 capsule 11  . traMADol (ULTRAM) 50 MG tablet Take 1 tablet (50 mg total) by mouth every 6 (six) hours as needed. (Patient not taking: Reported on 03/11/2018) 30 tablet 2   No current facility-administered medications for this visit.       Marland Kitchen  PHYSICAL EXAMINATION: ECOG PERFORMANCE STATUS: 0 - Asymptomatic  Vitals:   03/11/18 1514  BP: (!) 173/84  Pulse: (!) 103  Resp: 18  Temp: (!) 95.4 F (35.2 C)   Filed Weights   03/11/18 1514  Weight: 145 lb 2.8 oz (65.9 kg)    GENERAL: Well-nourished well-developed; Alert, no distress and comfortable.   Alone. EYES: no pallor or icterus OROPHARYNX: no thrush or ulceration; good dentition  NECK: supple, no masses felt LYMPH:  no palpable lymphadenopathy in the cervical, axillary or inguinal regions LUNGS: clear to auscultation and  No wheeze or crackles HEART/CVS: regular rate & rhythm and no murmurs; No lower extremity edema ABDOMEN: abdomen soft, non-tender and normal bowel sounds Musculoskeletal:no  cyanosis of digits and no clubbing  PSYCH: alert & oriented x 3 with fluent speech NEURO: no focal motor/sensory deficits SKIN:  no rashes or significant lesions  LABORATORY DATA:  I have reviewed the data as listed Lab Results  Component Value Date   WBC 8.8 02/25/2018   HGB 17.5 Repeated and verified X2. (H) 02/25/2018   HCT 52.6 Repeated and verified X2. (H) 02/25/2018   MCV 86.1 02/25/2018   PLT 306.0  02/25/2018   Recent Labs    02/25/18 0852  NA 142  K 4.6  CL 105  CO2 30  GLUCOSE 111*  BUN 11  CREATININE 0.79  CALCIUM 10.3  PROT 7.3  ALBUMIN 4.3  AST 11  ALT 11  ALKPHOS 111  BILITOT 0.6    RADIOGRAPHIC STUDIES: I have personally reviewed the radiological images as listed and agreed with the findings in the report. No results found.  ASSESSMENT & PLAN:   Erythrocytosis #Hemoglobin 17 hematocrit 52; with normal white count/platelets.  Etiology includes primary versus secondary-patient smokes/COPD.   #Recommend checking Jak 2 mutation/with reflex; erythropoietin level and LDH.  #I had a long discussion with the patient regarding signs and symptoms of polycythemia; and the treatment option of phlebotomy.  She also understands that phlebotomy is usually help patients who are symptomatic from primary erythrocytosis/polycythemia vera.   # Active smoker; counseled about quitting smoking; patient not interested at this time.  Will discuss LCSP.   #Patient follow-up with me in approximately 2 weeks; possible phlebotomy.  Thank you Dr. Glori Bickers for allowing me to participate in the care of your pleasant patient. Please do not hesitate to contact me with questions or concerns in the interim.    All questions were answered. The patient knows to call the clinic with any problems, questions or concerns.       Cammie Sickle, MD 03/11/2018 4:17 PM

## 2018-03-12 LAB — ERYTHROPOIETIN: Erythropoietin: 12 m[IU]/mL (ref 2.6–18.5)

## 2018-03-20 LAB — JAK2 EXONS 12-15

## 2018-03-20 LAB — JAK2  V617F QUAL. WITH REFLEX TO EXON 12

## 2018-03-21 DIAGNOSIS — Z720 Tobacco use: Secondary | ICD-10-CM | POA: Diagnosis not present

## 2018-03-21 DIAGNOSIS — Z79899 Other long term (current) drug therapy: Secondary | ICD-10-CM | POA: Diagnosis not present

## 2018-03-21 DIAGNOSIS — I712 Thoracic aortic aneurysm, without rupture: Secondary | ICD-10-CM | POA: Diagnosis not present

## 2018-03-25 ENCOUNTER — Inpatient Hospital Stay: Payer: Medicare HMO

## 2018-03-25 ENCOUNTER — Inpatient Hospital Stay (HOSPITAL_BASED_OUTPATIENT_CLINIC_OR_DEPARTMENT_OTHER): Payer: Medicare HMO | Admitting: Internal Medicine

## 2018-03-25 VITALS — BP 117/76 | Temp 97.6°F | Resp 16 | Wt 141.0 lb

## 2018-03-25 DIAGNOSIS — D751 Secondary polycythemia: Secondary | ICD-10-CM | POA: Diagnosis not present

## 2018-03-25 DIAGNOSIS — R69 Illness, unspecified: Secondary | ICD-10-CM | POA: Diagnosis not present

## 2018-03-25 DIAGNOSIS — F17210 Nicotine dependence, cigarettes, uncomplicated: Secondary | ICD-10-CM

## 2018-03-25 NOTE — Progress Notes (Signed)
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 clinical significance of this JAK2 alternative splicing is not known at current time." EPO-Normal;  # Thoracic aneurysm [[facility]]  #Smoker/COPD   No history exists.     HISTORY OF PRESENTING ILLNESS:  Ann Wu 72 y.o.  female long-standing history of smoking/COPD [not on home O2] is here to review the results of her above workup for her erythrocytosis that was ordered approximately 2 weeks ago.  Patient has intermittent headaches.  She has attributed this to her nicotine patch.  She continues to complain of mild fatigue.  ROS: A complete 10 point review of system is done which is negative except mentioned above in history of present illness  MEDICAL HISTORY:  Past Medical History:  Diagnosis Date  . Allergic rhinitis, seasonal    mild  . Bulging lumbar disc 10/99   L 3-4, L 4-5  . COPD (chronic obstructive pulmonary disease) ([place])    likely per CXR  . Dupuytren's contracture of hand   . Gastritis    EGD 03/2005, 11/10  . Hyperlipidemia   . Hypertension 09/07/2011  . Osteopenia   . Osteopenia    dexa 2003, worse 2006, slighty worse 2010  . Smoker   . Thoracic aortic aneurysm ([place]) 8/12    SURGICAL HISTORY: Past Surgical History:  Procedure Laterality Date  . ABDOMINAL HYSTERECTOMY    . BREAST CYST EXCISION  1980  . CHOLECYSTECTOMY    . cyst aspirations      x 2- breast  . 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 Marital status: Married    Spouse name: Not on file  . Number of children: 2  . Years of  education: Not on file  . Highest education level: Not on file  Occupational History  . Occupation: Retired    Fish farm manager: RETIRED    Comment: Arboriculturist  . Financial resource strain: Not on file  . Food insecurity:    Worry: Not on file    Inability: Not on file  . Transportation needs:    Medical: Not on file    Non-medical: Not on file  Tobacco Use  . Smoking status: Current Some Day Smoker    Packs/day: 1.00    Years: 51.00    Pack years: 51.00    Types: Cigarettes  . Smokeless tobacco: Never Used  . Tobacco comment: occasional cigaretts   Substance and Sexual Activity  . Alcohol use: No    Alcohol/week: 0.0 oz  . Drug use: No  . Sexual activity: Not Currently  Lifestyle  . Physical activity:    Days per week: Not on file    Minutes per session: Not on file  . Stress: Not on file  Relationships  . Social connections:    Talks on phone: Not on file    Gets together: Not on file    Attends religious service: Not on file    Active member of club or organization: Not on file    Attends meetings of clubs or organizations: Not on file    Relationship status: Not on file  .  Intimate partner violence:    Fear of current or ex partner: Not on file    Emotionally abused: Not on file    Physically abused: Not on file    Forced sexual activity: Not on file  Other Topics Concern  . Not on file  Social History Narrative   Has twin grandchildren    FAMILY HISTORY: mom- breast cancer- at 87y; dad- Dx- stomach/lung cancer; brother- no cancers.  Family History  Problem Relation Age of Onset  . Cancer Mother        breast  . Coronary artery disease Mother   . Heart attack Mother   . Stomach cancer Father   . Lung cancer Father   . Cancer Maternal Grandmother 81       " blood and lymph nodes " per pt  . Dementia Maternal Grandfather     ALLERGIES:  is allergic to statins; amoxicillin-pot clavulanate; and codeine.  MEDICATIONS:  Current Outpatient  Medications  Medication Sig Dispense Refill  . acetaminophen (TYLENOL) 325 MG tablet Take 650 mg by mouth as needed.     Marland Kitchen amLODipine (NORVASC) 10 MG tablet Take 1 tablet (10 mg total) by mouth daily. 30 tablet 11  . aspirin EC 81 MG tablet Take 81 mg by mouth daily.    . cyclobenzaprine (FLEXERIL) 5 MG tablet Take 1 tablet (5 mg total) by mouth 3 (three) times daily as needed. 30 tablet 3  . metoprolol tartrate (LOPRESSOR) 25 MG tablet Take 1 tablet (25 mg total) by mouth daily. 30 tablet 11  . omeprazole (PRILOSEC) 20 MG capsule Take 1 capsule (20 mg total) by mouth daily as needed. 30 capsule 11  . sertraline (ZOLOFT) 25 MG tablet Take 1 tablet (25 mg total) by mouth daily. 30 tablet 1  . traMADol (ULTRAM) 50 MG tablet Take 1 tablet (50 mg total) by mouth every 6 (six) hours as needed. 30 tablet 2  . ALPRAZolam (XANAX) 0.5 MG tablet TAKE ONE TABLET DAILY AS NEEDED FOR ANXIETY. (Patient not taking: Reported on 03/11/2018) 30 tablet 0   No current facility-administered medications for this visit.       Marland Kitchen  PHYSICAL EXAMINATION: ECOG PERFORMANCE STATUS: 0 - Asymptomatic  Vitals:   03/25/18 1343  BP: 117/76  Resp: 16  Temp: 97.6 F (36.4 C)   Filed Weights   03/25/18 1343  Weight: 141 lb (64 kg)    GENERAL: Well-nourished well-developed; Alert, no distress and comfortable.   Alone. EYES: no pallor or icterus OROPHARYNX: no thrush or ulceration; good dentition  NECK: supple, no masses felt LYMPH:  no palpable lymphadenopathy in the cervical, axillary or inguinal regions LUNGS: clear to auscultation and  No wheeze or crackles HEART/CVS: regular rate & rhythm and no murmurs; No lower extremity edema ABDOMEN: abdomen soft, non-tender and normal bowel sounds Musculoskeletal:no cyanosis of digits and no clubbing  PSYCH: alert & oriented x 3 with fluent speech NEURO: no focal motor/sensory deficits SKIN:  no rashes or significant lesions  LABORATORY DATA:  I have reviewed the data  as listed Lab Results  Component Value Date   WBC 8.8 02/25/2018   HGB 17.5 Repeated and verified X2. (H) 02/25/2018   HCT 52.6 Repeated and verified X2. (H) 02/25/2018   MCV 86.1 02/25/2018   PLT 306.0 02/25/2018   Recent Labs    02/25/18 0852  NA 142  K 4.6  CL 105  CO2 30  GLUCOSE 111*  BUN 11  CREATININE 0.79  CALCIUM  10.3  PROT 7.3  ALBUMIN 4.3  AST 11  ALT 11  ALKPHOS 111  BILITOT 0.6    RADIOGRAPHIC STUDIES: I have personally reviewed the radiological images as listed and agreed with the findings in the report. No results found.  ASSESSMENT & PLAN:   Erythrocytosis #Hemoglobin 17 hematocrit 52; with normal white count/platelets-likely secondary to smoking/secondary erythrocytosis. I do not suspect primary bone marrow process like polycythemia vera [erythropoietin levels normal; ?  Alternative splicing exam [id number] insignificant].  # Mild headchaes-? Nicotine patch vs elevated hematocrit.  Improved with Tylenol.  #Discussed regarding smoking cessation; patient states that she is trying to quit; she is using a nicotine patch.  # Lung cancer screening-discussed the rationale; patient is interested refer to LCS program  #Phlebotomy monthly to be scheduled/[facility] one in end of April- june]; patient follow-up second week of July 2019 CBC   All questions were answered. The patient knows to call the clinic with any problems, questions or concerns.       Cammie Sickle, MD 03/25/2018 2:28 PM

## 2018-03-25 NOTE — Assessment & Plan Note (Addendum)
#  Hemoglobin 17 hematocrit 52; with normal white count/platelets-likely secondary to smoking/secondary erythrocytosis. I do not suspect primary bone marrow process like polycythemia vera [erythropoietin levels normal; ?  Alternative splicing exam [id number] insignificant].  # Mild headchaes-? Nicotine patch vs elevated hematocrit.  Improved with Tylenol.  #Discussed regarding smoking cessation; patient states that she is trying to quit; she is using a nicotine patch.  # Lung cancer screening-discussed the rationale; patient is interested refer to LCS program  #Phlebotomy monthly to be scheduled/[place] one in end of April- june]; patient follow-up second week of July 2019 CBC

## 2018-03-26 ENCOUNTER — Telehealth: Payer: Self-pay | Admitting: *Deleted

## 2018-03-26 NOTE — Telephone Encounter (Signed)
Received referral for initial lung cancer screening scan. Contacted patient who reports recent scan at [facility], which is confirmed. Will follow in future for need of lung screening. Patient is in agreement with this plan.

## 2018-04-07 ENCOUNTER — Other Ambulatory Visit: Payer: Self-pay

## 2018-04-07 ENCOUNTER — Inpatient Hospital Stay: Payer: Medicare HMO

## 2018-04-07 DIAGNOSIS — D751 Secondary polycythemia: Secondary | ICD-10-CM | POA: Diagnosis not present

## 2018-04-07 LAB — CBC WITH DIFFERENTIAL/PLATELET
BASOS ABS: 0 10*3/uL (ref 0–0.1)
Basophils Relative: 1 %
Eosinophils Absolute: 0.2 10*3/uL (ref 0–0.7)
Eosinophils Relative: 3 %
HEMATOCRIT: 48.3 % — AB (ref 35.0–47.0)
HEMOGLOBIN: 16.5 g/dL — AB (ref 12.0–16.0)
LYMPHS PCT: 21 %
Lymphs Abs: 1.5 10*3/uL (ref 1.0–3.6)
MCH: 29.1 pg (ref 26.0–34.0)
MCHC: 34.2 g/dL (ref 32.0–36.0)
MCV: 85.1 fL (ref 80.0–100.0)
MONO ABS: 0.4 10*3/uL (ref 0.2–0.9)
Monocytes Relative: 6 %
NEUTROS ABS: 5.1 10*3/uL (ref 1.4–6.5)
NEUTROS PCT: 69 %
Platelets: 274 10*3/uL (ref 150–440)
RBC: 5.68 MIL/uL — AB (ref 3.80–5.20)
RDW: 14 % (ref 11.5–14.5)
WBC: 7.3 10*3/uL (ref 3.6–11.0)

## 2018-04-16 ENCOUNTER — Telehealth: Payer: Self-pay

## 2018-04-16 NOTE — Telephone Encounter (Signed)
Copied from [place] [id number]. Topic: General - Other >> Apr 16, 2018  4:00 PM Aurelio Brash B wrote: Reason for CRM: PT's daughter is asking if Dr Glori Bickers would be able to discuss dementia  with the pt as her family are worried about her because she repeats everything and can not remember anything.

## 2018-04-16 NOTE — Telephone Encounter (Signed)
It looks like there is a DPR to talk with her - please ask her if patient is aware that they are concerned  I need her to come for a visit with the patient to talk to me about it   (it helps to have both of them)- thanks

## 2018-04-17 NOTE — Telephone Encounter (Signed)
Good/thank you  See her then

## 2018-04-17 NOTE — Telephone Encounter (Signed)
Pt has scheduled an appt on Monday, per daughter she will come with her to appt to address memory issues too

## 2018-04-21 ENCOUNTER — Encounter: Payer: Self-pay | Admitting: Family Medicine

## 2018-04-21 ENCOUNTER — Ambulatory Visit: Payer: Medicare HMO | Admitting: Family Medicine

## 2018-04-21 VITALS — BP 134/76 | HR 92 | Temp 98.0°F | Ht 64.5 in | Wt 143.5 lb

## 2018-04-21 DIAGNOSIS — I712 Thoracic aortic aneurysm, without rupture, unspecified: Secondary | ICD-10-CM

## 2018-04-21 DIAGNOSIS — G3184 Mild cognitive impairment, so stated: Secondary | ICD-10-CM

## 2018-04-21 DIAGNOSIS — I10 Essential (primary) hypertension: Secondary | ICD-10-CM

## 2018-04-21 DIAGNOSIS — E7800 Pure hypercholesterolemia, unspecified: Secondary | ICD-10-CM | POA: Diagnosis not present

## 2018-04-21 DIAGNOSIS — R69 Illness, unspecified: Secondary | ICD-10-CM | POA: Diagnosis not present

## 2018-04-21 DIAGNOSIS — F418 Other specified anxiety disorders: Secondary | ICD-10-CM

## 2018-04-21 DIAGNOSIS — F17210 Nicotine dependence, cigarettes, uncomplicated: Secondary | ICD-10-CM | POA: Diagnosis not present

## 2018-04-21 DIAGNOSIS — D582 Other hemoglobinopathies: Secondary | ICD-10-CM | POA: Diagnosis not present

## 2018-04-21 MED ORDER — DONEPEZIL HCL 5 MG PO TABS: 5.0000 mg | ORAL_TABLET | Freq: Every day | ORAL | 11 refills | Status: DC

## 2018-04-21 MED ORDER — SERTRALINE HCL 50 MG PO TABS: 50.0000 mg | ORAL_TABLET | Freq: Every day | ORAL | 11 refills | Status: DC

## 2018-04-21 NOTE — Assessment & Plan Note (Signed)
bp in fair control at this time  BP Readings from Last 1 Encounters:  04/21/18 134/76   No changes needed Most recent labs reviewed  Disc lifstyle change with low sodium diet and exercise

## 2018-04-21 NOTE — Patient Instructions (Addendum)
Increase your zoloft to 50 mg daily   Start exercise if you can --walking  Socializing and reading are helpful   For cholesterol  Avoid red meat/ fried foods/ egg yolks/ fatty breakfast meats/ butter, cheese and high fat dairy/ and shellfish    Keep thinking about quitting smoking An e cig is an option    Start aricept 5 mg at bedtime -if any side effects please let us know    Follow up in about 6 weeks please

## 2018-04-21 NOTE — Assessment & Plan Note (Signed)
Disc in detail risks of smoking and possible outcomes including copd, vascular/ heart disease, cancer , respiratory and sinus infections  Pt voices understanding  Again stressed imp in reducing stroke risk (in light of vascular dz)

## 2018-04-21 NOTE — Assessment & Plan Note (Signed)
Worsened anx/irritability with some cognitive slowing and short term memory problems  Disc stressors  Reviewed stressors/ coping techniques/symptoms/ support sources/ tx options and side effects in detail today Ref to counseling Stop xanax (not helping and can worsen memory problems) and inc zoloft to 50 mg daily  Discussed expectations of SSRI medication including time to effectiveness and mechanism of action, also poss of side effects (early and late)- including mental fuzziness, weight or appetite change, nausea and poss of worse dep or anxiety (even suicidal thoughts)  Pt voiced understanding and will stop med and update if this occurs   F/u 6 wk or earlier if needed

## 2018-04-21 NOTE — Assessment & Plan Note (Signed)
Seeing hematology  Will be starting phlebotomy tx  Strongly enc to quit smoking

## 2018-04-21 NOTE — Assessment & Plan Note (Signed)
May plan surgery for this soon

## 2018-04-21 NOTE — Assessment & Plan Note (Signed)
In pt with hx of vascular dz/smoking/hyperlipidemia  Disc imp of lifestyle change to prevent strokes  Suspect her cognitive issues are multifactorial - depression/ anxiety (irritability) play a role  Disc role of medications  Disc poss early dementia Reassuring score of 32 on her MMS exam (lost points for date) Interview notable for repeating herself/ irritability/ word finding issues Recommend better self care (see AVS)-diet/exercise and smoking cessation  Brain work- reading/socializing Will stop [employer] zoloft to 50 mg for anx/irritability and ref to counseling Start aricept 5 mg (handout given)- to titrate to 10 if tolerated at 6 wk f/u  Last labs rev

## 2018-04-21 NOTE — Progress Notes (Signed)
Subjective:    Patient ID: Ann Wu, female    DOB: 1946/09/27, 72 y.o.   MRN: [id number]  HPI Here for concerns about memory   It is very frustrating  Repeats things frequently -every 10 minutes  Worse in the past month  (happening last 3 months)  Irritable occ has word finding  Thinks her concentration is fair  Gets aggravated (doing finances)   Per husband -has left stove or oven on   Stress level is 6-7 out of 10  Feels unmotivated and sleeps more   (early awakening)  More naps lately   Has worked on her diet lately   Likes some fruit Likes green veggies  Does not eat fish     Wt Readings from Last 3 Encounters:  04/21/18 143 lb 8 oz (65.1 kg)  03/25/18 141 lb (64 kg)  03/11/18 145 lb 2.8 oz (65.9 kg)   24.25 kg/m   meds  Xanax-not helping like it used to (does not take often) zoloft  Takes aleve occ She feels really antsy   Not taking flexeril or tramadol right now   BP Readings from Last 3 Encounters:  04/21/18 134/76  03/25/18 117/76  03/11/18 (!) 173/84   Hx of vascular Toni Amend dz including carotid  Lab Results  Component Value Date   CREATININE 0.79 02/25/2018   BUN 11 02/25/2018   NA 142 02/25/2018   K 4.6 02/25/2018   CL 105 02/25/2018   CO2 30 02/25/2018   Lab Results  Component Value Date   ALT 11 02/25/2018   AST 11 02/25/2018   ALKPHOS 111 02/25/2018   BILITOT 0.6 02/25/2018    Lab Results  Component Value Date   CHOL 196 02/25/2018   HDL 41.30 02/25/2018   LDLCALC 118 (H) 02/25/2018   LDLDIRECT 166.0 07/25/2012   TRIG 183.0 (H) 02/25/2018   CHOLHDL 5 02/25/2018   Lab Results  Component Value Date   WBC 7.3 04/07/2018   HGB 16.5 (H) 04/07/2018   HCT 48.3 (H) 04/07/2018   MCV 85.1 04/07/2018   PLT 274 04/07/2018  went to hematology for her elevated Hb  She will be phlebotomized   Cannot take statins    MMS exam today : score of 32  Had carotid doppler in the past year - she had some stenosis  Has AAA being  followed by vascular   Last saw a counselor years ago   Patient Active Problem List   Diagnosis Date Noted  . Mild cognitive impairment 04/21/2018  . Erythrocytosis 03/11/2018  . Elevated hemoglobin ([place]) 02/27/2018  . Carotid arterial disease ([place]) 11/19/2017  . Subclavian artery stenosis ([place]) 11/19/2017  . Right knee sprain 05/01/2017  . Avulsion fracture of navicular bone of foot 05/01/2017  . Anxiety attack 12/20/2014  . Depression with anxiety 10/09/2011  . Hypertension 09/07/2011  . Other screening mammogram 09/07/2011  . Thoracic aortic aneurysm ([place])   . Aneurysm of aorta ([place]) 08/07/2011  . Back pain 07/24/2011  . HYPERKALEMIA 09/04/2010  . 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 HYPERGLYCEMIA 09/04/2010  . COLONIC POLYPS 03/31/2009  . Hyperlipidemia 03/24/2007  . Light smoker 03/24/2007  . ALLERGIC RHINITIS 03/24/2007  . COPD (chronic obstructive pulmonary disease) ([place]) 03/24/2007  . FIBROCYSTIC BREAST DISEASE 03/24/2007  . OSTEOPENIA 03/24/2007  . SLEEP APNEA, MILD 03/24/2007   Past Medical History:  Diagnosis Date  . Allergic rhinitis, seasonal    mild  . Bulging lumbar disc 10/99   L 3-4, L 4-5  .  COPD (chronic obstructive pulmonary disease) ([place])    likely per CXR  . Dupuytren's contracture of hand   . Gastritis    EGD 03/2005, 11/10  . Hyperlipidemia   . Hypertension 09/07/2011  . Osteopenia   . Osteopenia    dexa 2003, worse 2006, slighty worse 2010  . Smoker   . Thoracic aortic aneurysm ([place]) 8/12   Past Surgical History:  Procedure Laterality Date  . ABDOMINAL HYSTERECTOMY    . BREAST CYST EXCISION  1980  . CHOLECYSTECTOMY    . cyst aspirations      x 2- breast  . gyn surgery  09/1999   hysterectomy- fibroids   Social History   Tobacco Use  . Smoking status: Current Some Day Smoker    Packs/day: 1.00    Years: 51.00    Pack years: 51.00    Types: Cigarettes  . Smokeless tobacco: Never Used  . Tobacco comment: occasional cigaretts     Substance Use Topics  . Alcohol use: No    Alcohol/week: 0.0 oz  . Drug use: No   Family History  Problem Relation Age of Onset  . Cancer Mother        breast  . Coronary artery disease Mother   . Heart attack Mother   . Stomach cancer Father   . Lung cancer Father   . Cancer Maternal Grandmother 32       " blood and lymph nodes " per pt  . Dementia Maternal Grandfather    Allergies  Allergen Reactions  . Statins Other (See Comments)  . Amoxicillin-Pot Clavulanate   . Codeine     REACTION: nausea and vomiting   Current Outpatient Medications on File Prior to Visit  Medication Sig Dispense Refill  . amLODipine (NORVASC) 10 MG tablet Take 1 tablet (10 mg total) by mouth daily. 30 tablet 11  . aspirin EC 81 MG tablet Take 81 mg by mouth daily.    . cyclobenzaprine (FLEXERIL) 5 MG tablet Take 1 tablet (5 mg total) by mouth 3 (three) times daily as needed. 30 tablet 3  . metoprolol tartrate (LOPRESSOR) 25 MG tablet Take 1 tablet (25 mg total) by mouth daily. 30 tablet 11  . omeprazole (PRILOSEC) 20 MG capsule Take 1 capsule (20 mg total) by mouth daily as needed. 30 capsule 11  . traMADol (ULTRAM) 50 MG tablet Take 1 tablet (50 mg total) by mouth every 6 (six) hours as needed. 30 tablet 2   No current facility-administered medications on file prior to visit.     Review of Systems  Constitutional: Positive for fatigue. Negative for activity change, appetite change, fever and unexpected weight change.  HENT: Negative for congestion, ear pain, rhinorrhea, sinus pressure and sore throat.   Eyes: Negative for pain, redness and visual disturbance.  Respiratory: Negative for cough, shortness of breath and wheezing.   Cardiovascular: Negative for chest pain and palpitations.  Gastrointestinal: Negative for abdominal pain, blood in stool, constipation and diarrhea.  Endocrine: Negative for polydipsia and polyuria.  Genitourinary: Negative for dysuria, frequency and urgency.   Musculoskeletal: Negative for arthralgias, back pain and myalgias.  Skin: Negative for pallor and rash.  Allergic/Immunologic: Negative for environmental allergies.  Neurological: Negative for dizziness, syncope and headaches.  Hematological: Negative for adenopathy. Does not bruise/bleed easily.  Psychiatric/Behavioral: Positive for decreased concentration, dysphoric mood and sleep disturbance. Negative for self-injury and suicidal ideas. The patient is nervous/anxious.        Objective:   Physical Exam  Constitutional: She appears well-developed and well-nourished. No distress.  HENT:  Head: Normocephalic and atraumatic.  Eyes: Pupils are equal, round, and reactive to light. Conjunctivae and EOM are normal.  Neck: Normal range of motion. Neck supple. Carotid bruit is present.  Cardiovascular: Normal rate, regular rhythm and normal heart sounds.  Lymphadenopathy:    She has no cervical adenopathy.  Skin: Skin is warm and dry. No rash noted. She is not diaphoretic.  Psychiatric: Her speech is normal and behavior is normal. Thought content normal. Her mood appears anxious. Her affect is not blunt and not labile. Thought content is not paranoid. She exhibits a depressed mood. She expresses no homicidal and no suicidal ideation. She exhibits abnormal recent memory.  Pt candidly disc symptoms and stressors  Does repeat herself frequently  MMS score 32 (good) missing points for date and day of week  She is attentive.          Assessment & Plan:   Problem List Items Addressed This Visit      Cardiovascular and Mediastinum   Hypertension    bp in fair control at this time  BP Readings from Last 1 Encounters:  04/21/18 134/76   No changes needed Most recent labs reviewed  Disc lifstyle change with low sodium diet and exercise        Thoracic aortic aneurysm [facility])    May plan surgery for this soon         Nervous and Auditory   Mild cognitive impairment - Primary    In  pt with hx of vascular dz/smoking/hyperlipidemia  Disc imp of lifestyle change to prevent strokes  Suspect her cognitive issues are multifactorial - depression/ anxiety (irritability) play a role  Disc role of medications  Disc poss early dementia Reassuring score of 32 on her MMS exam (lost points for date) Interview notable for repeating herself/ irritability/ word finding issues Recommend better self care (see AVS)-diet/exercise and smoking cessation  Brain work- reading/socializing Will stop [employer] zoloft to 50 mg for anx/irritability and ref to counseling Start aricept 5 mg (handout given)- to titrate to 10 if tolerated at 6 wk f/u  Last labs rev          Other   Depression with anxiety    Worsened anx/irritability with some cognitive slowing and short term memory problems  Disc stressors  Reviewed stressors/ coping techniques/symptoms/ support sources/ tx options and side effects in detail today Ref to counseling Stop xanax (not helping and can worsen memory problems) and inc zoloft to 50 mg daily  Discussed expectations of SSRI medication including time to effectiveness and mechanism of action, also poss of side effects (early and late)- including mental fuzziness, weight or appetite change, nausea and poss of worse dep or anxiety (even suicidal thoughts)  Pt voiced understanding and will stop med and update if this occurs   F/u 6 wk or earlier if needed       Relevant Medications   sertraline (ZOLOFT) 50 MG tablet   Other Relevant Orders   Ambulatory referral to Psychology   Elevated hemoglobin ([place])    Seeing hematology  Will be starting phlebotomy tx  Strongly enc to quit smoking      Hyperlipidemia    Into of statins Disc goals for lipids and reasons to control them Rev last labs with pt Rev low sat fat diet in detail  Will work harder on diet       Light smoker  Disc in detail risks of smoking and possible outcomes including copd, vascular/ heart  disease, cancer , respiratory and sinus infections  Pt voices understanding  Again stressed imp in reducing stroke risk (in light of vascular dz)

## 2018-04-21 NOTE — Assessment & Plan Note (Signed)
Into of statins Disc goals for lipids and reasons to control them Rev last labs with pt Rev low sat fat diet in detail  Will work harder on diet

## 2018-04-28 ENCOUNTER — Inpatient Hospital Stay: Payer: Medicare HMO | Attending: Internal Medicine

## 2018-04-28 ENCOUNTER — Inpatient Hospital Stay: Payer: Medicare HMO

## 2018-04-28 ENCOUNTER — Other Ambulatory Visit: Payer: Self-pay

## 2018-04-28 ENCOUNTER — Other Ambulatory Visit: Payer: Self-pay | Admitting: Internal Medicine

## 2018-04-28 DIAGNOSIS — D751 Secondary polycythemia: Secondary | ICD-10-CM | POA: Insufficient documentation

## 2018-04-28 LAB — CBC WITH DIFFERENTIAL/PLATELET
Basophils Absolute: 0.1 10*3/uL (ref 0–0.1)
Basophils Relative: 1 %
Eosinophils Absolute: 0.2 10*3/uL (ref 0–0.7)
Eosinophils Relative: 2 %
HEMATOCRIT: 44.1 % (ref 35.0–47.0)
Hemoglobin: 15.2 g/dL (ref 12.0–16.0)
LYMPHS ABS: 1.5 10*3/uL (ref 1.0–3.6)
Lymphocytes Relative: 17 %
MCH: 29.5 pg (ref 26.0–34.0)
MCHC: 34.4 g/dL (ref 32.0–36.0)
MCV: 85.8 fL (ref 80.0–100.0)
Monocytes Absolute: 0.5 10*3/uL (ref 0.2–0.9)
Monocytes Relative: 6 %
NEUTROS ABS: 6.5 10*3/uL (ref 1.4–6.5)
NEUTROS PCT: 74 %
PLATELETS: 282 10*3/uL (ref 150–440)
RBC: 5.14 MIL/uL (ref 3.80–5.20)
RDW: 14.3 % (ref 11.5–14.5)
WBC: 8.7 10*3/uL (ref 3.6–11.0)

## 2018-04-29 ENCOUNTER — Ambulatory Visit: Payer: Medicare HMO | Admitting: Family Medicine

## 2018-04-29 ENCOUNTER — Encounter: Payer: Self-pay | Admitting: Family Medicine

## 2018-04-29 VITALS — BP 146/76 | HR 72 | Temp 98.5°F | Ht 64.5 in | Wt 141.0 lb

## 2018-04-29 DIAGNOSIS — N3000 Acute cystitis without hematuria: Secondary | ICD-10-CM

## 2018-04-29 DIAGNOSIS — N390 Urinary tract infection, site not specified: Secondary | ICD-10-CM | POA: Insufficient documentation

## 2018-04-29 DIAGNOSIS — R300 Dysuria: Secondary | ICD-10-CM

## 2018-04-29 DIAGNOSIS — R69 Illness, unspecified: Secondary | ICD-10-CM | POA: Diagnosis not present

## 2018-04-29 DIAGNOSIS — F17210 Nicotine dependence, cigarettes, uncomplicated: Secondary | ICD-10-CM | POA: Diagnosis not present

## 2018-04-29 LAB — POC URINALSYSI DIPSTICK (AUTOMATED)
Ketones, UA: NEGATIVE
LEUKOCYTES UA: NEGATIVE
PH UA: 6 (ref 5.0–8.0)
PROTEIN UA: NEGATIVE
RBC UA: NEGATIVE
Spec Grav, UA: 1.025 (ref 1.010–1.025)

## 2018-04-29 MED ORDER — SULFAMETHOXAZOLE-TRIMETHOPRIM 800-160 MG PO TABS: 1.0000 | ORAL_TABLET | Freq: Two times a day (BID) | ORAL | 0 refills | Status: DC

## 2018-04-29 NOTE — Patient Instructions (Signed)
Drink lots of water  Take the bactrim DS as directed   We will alert you when your urine culture returns if we need to change anything   Update if not starting to improve in a week or if worsening

## 2018-04-29 NOTE — Progress Notes (Signed)
Subjective:    Patient ID: Ann Wu, female    DOB: 09-28-1946, 72 y.o.   MRN: [id number]  HPI Here for urinary symptoms  Started sat night  Dysuria - hurts to urinate  Urine frequency - feels urge to go constantly   Got azo -helped some   No fever or n/v   No incontinence   No blood in urine   Some bladder pressure   Wt Readings from Last 3 Encounters:  04/29/18 141 lb (64 kg)  04/21/18 143 lb 8 oz (65.1 kg)  03/25/18 141 lb (64 kg)   23.83 kg/m   Taking azo  Results for orders placed or performed in visit on 04/29/18  POCT Urinalysis Dipstick (Automated)  Result Value Ref Range   Color, UA Orange    Clarity, UA Clear    Glucose, UA  Negative   Bilirubin, UA     Ketones, UA Negative    Spec Grav, UA 1.025 1.010 - 1.025   Blood, UA Negative    pH, UA 6.0 5.0 - 8.0   Protein, UA Negative Negative   Urobilinogen, UA  0.2 or 1.0 E.U./dL   Nitrite, UA     Leukocytes, UA Negative Negative  POCT UA - Microscopic Only  Result Value Ref Range   WBC, Ur, HPF, POC many    RBC, urine, microscopic many    Bacteria, U Microscopic many    Mucus, UA few    Epithelial cells, urine per micros few    Crystals, Ur, HPF, POC few    Casts, Ur, LPF, POC few    Yeast, UA none     Patient Active Problem List   Diagnosis Date Noted  . UTI (urinary tract infection) 04/29/2018  . Mild cognitive impairment 04/21/2018  . Erythrocytosis 03/11/2018  . Elevated hemoglobin ([place]) 02/27/2018  . Carotid arterial disease ([place]) 11/19/2017  . Subclavian artery stenosis ([place]) 11/19/2017  . Right knee sprain 05/01/2017  . Avulsion fracture of navicular bone of foot 05/01/2017  . Anxiety attack 12/20/2014  . Depression with anxiety 10/09/2011  . Hypertension 09/07/2011  . Other screening mammogram 09/07/2011  . Thoracic aortic aneurysm ([place])   . Aneurysm of aorta ([place]) 08/07/2011  . Back pain 07/24/2011  . HYPERKALEMIA 09/04/2010  . 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  HYPERGLYCEMIA 09/04/2010  . COLONIC POLYPS 03/31/2009  . Hyperlipidemia 03/24/2007  . Light smoker 03/24/2007  . ALLERGIC RHINITIS 03/24/2007  . COPD (chronic obstructive pulmonary disease) ([place]) 03/24/2007  . FIBROCYSTIC BREAST DISEASE 03/24/2007  . OSTEOPENIA 03/24/2007  . SLEEP APNEA, MILD 03/24/2007   Past Medical History:  Diagnosis Date  . Allergic rhinitis, seasonal    mild  . Bulging lumbar disc 10/99   L 3-4, L 4-5  . COPD (chronic obstructive pulmonary disease) ([place])    likely per CXR  . Dupuytren's contracture of hand   . Gastritis    EGD 03/2005, 11/10  . Hyperlipidemia   . Hypertension 09/07/2011  . Osteopenia   . Osteopenia    dexa 2003, worse 2006, slighty worse 2010  . Smoker   . Thoracic aortic aneurysm ([place]) 8/12   Past Surgical History:  Procedure Laterality Date  . ABDOMINAL HYSTERECTOMY    . BREAST CYST EXCISION  1980  . CHOLECYSTECTOMY    . cyst aspirations      x 2- breast  . gyn surgery  09/1999   hysterectomy- fibroids   Social History   Tobacco Use  . Smoking status:  Current Some Day Smoker    Packs/day: 1.00    Years: 51.00    Pack years: 51.00    Types: Cigarettes  . Smokeless tobacco: Never Used  . Tobacco comment: occasional cigaretts   Substance Use Topics  . Alcohol use: No    Alcohol/week: 0.0 oz  . Drug use: No   Family History  Problem Relation Age of Onset  . Cancer Mother        breast  . Coronary artery disease Mother   . Heart attack Mother   . Stomach cancer Father   . Lung cancer Father   . Cancer Maternal Grandmother 76       " blood and lymph nodes " per pt  . Dementia Maternal Grandfather    Allergies  Allergen Reactions  . Statins Other (See Comments)  . Amoxicillin-Pot Clavulanate   . Codeine     REACTION: nausea and vomiting   Current Outpatient Medications on File Prior to Visit  Medication Sig Dispense Refill  . amLODipine (NORVASC) 10 MG tablet Take 1 tablet (10 mg total) by mouth daily. 30  tablet 11  . aspirin EC 81 MG tablet Take 81 mg by mouth daily.    . cyclobenzaprine (FLEXERIL) 5 MG tablet Take 1 tablet (5 mg total) by mouth 3 (three) times daily as needed. 30 tablet 3  . donepezil (ARICEPT) 5 MG tablet Take 1 tablet (5 mg total) by mouth at bedtime. 30 tablet 11  . metoprolol tartrate (LOPRESSOR) 25 MG tablet Take 1 tablet (25 mg total) by mouth daily. 30 tablet 11  . omeprazole (PRILOSEC) 20 MG capsule Take 1 capsule (20 mg total) by mouth daily as needed. 30 capsule 11  . sertraline (ZOLOFT) 50 MG tablet Take 1 tablet (50 mg total) by mouth daily. (Patient taking differently: Take 25 mg by mouth 2 (two) times daily. ) 30 tablet 11  . traMADol (ULTRAM) 50 MG tablet Take 1 tablet (50 mg total) by mouth every 6 (six) hours as needed. 30 tablet 2   No current facility-administered medications on file prior to visit.     Review of Systems  Constitutional: Negative for activity change, appetite change, fatigue, fever and unexpected weight change.  HENT: Negative for congestion, ear pain, rhinorrhea, sinus pressure and sore throat.   Eyes: Negative for pain, redness, itching and visual disturbance.  Respiratory: Negative for cough, shortness of breath and wheezing.   Cardiovascular: Negative for chest pain, palpitations and leg swelling.  Gastrointestinal: Negative for abdominal distention, abdominal pain, blood in stool, constipation, diarrhea and nausea.  Endocrine: Negative for cold intolerance, polydipsia and polyuria.  Genitourinary: Positive for dysuria, frequency and urgency. Negative for decreased urine volume, difficulty urinating, flank pain and hematuria.  Musculoskeletal: Negative for arthralgias, back pain and myalgias.  Skin: Negative for pallor and rash.  Allergic/Immunologic: Negative for environmental allergies and immunocompromised state.  Neurological: Negative for dizziness, syncope, weakness and headaches.  Hematological: Negative for adenopathy. Does not  bruise/bleed easily.  Psychiatric/Behavioral: Positive for decreased concentration. Negative for dysphoric mood. The patient is not nervous/anxious.        Objective:   Physical Exam  Constitutional: She appears well-developed and well-nourished. No distress.  Well appearing  HENT:  Head: Normocephalic and atraumatic.  Eyes: Pupils are equal, round, and reactive to light. Conjunctivae and EOM are normal.  Neck: Normal range of motion. Neck supple.  Cardiovascular: Normal rate, regular rhythm and normal heart sounds.  Pulmonary/Chest: Effort normal and breath sounds  normal.  Diffusely distant bs   Abdominal: Soft. Bowel sounds are normal. She exhibits no distension. There is tenderness. There is no rebound.  No cva tenderness  Mild suprapubic tenderness  Musculoskeletal: She exhibits no edema.  Lymphadenopathy:    She has no cervical adenopathy.  Neurological: She is alert.  Skin: No rash noted.  Psychiatric: She has a normal mood and affect.          Assessment & Plan:   Problem List Items Addressed This Visit      Genitourinary   UTI (urinary tract infection) - Primary    tx with bactrim DS Disc symptomatic care - see instructions on AVS  Handout given  Enc water intake  cx pending-will contact  Update if not starting to improve in a week or if worsening        Relevant Medications   sulfamethoxazole-trimethoprim (BACTRIM DS,SEPTRA DS) 800-160 MG tablet   Other Relevant Orders   POCT UA - Microscopic Only (Completed)   Urine Culture     Other   Light smoker    Disc in detail risks of smoking and possible outcomes including copd, vascular/ heart disease, cancer , respiratory and sinus infections  Pt voices understanding She has cut down but not ready to quit       Other Visit Diagnoses    Dysuria       Relevant Orders   POCT Urinalysis Dipstick (Automated) (Completed)   POCT UA - Microscopic Only (Completed)   Urine Culture

## 2018-04-30 LAB — POCT UA - MICROSCOPIC ONLY

## 2018-04-30 LAB — URINE CULTURE
MICRO NUMBER: 90616648
Result:: NO GROWTH
SPECIMEN QUALITY: ADEQUATE

## 2018-04-30 NOTE — Assessment & Plan Note (Signed)
tx with bactrim DS Disc symptomatic care - see instructions on AVS  Handout given  Enc water intake  cx pending-will contact  Update if not starting to improve in a week or if worsening

## 2018-04-30 NOTE — Assessment & Plan Note (Signed)
Disc in detail risks of smoking and possible outcomes including copd, vascular/ heart disease, cancer , respiratory and sinus infections  Pt voices understanding  She has cut down but not ready to quit  

## 2018-05-01 ENCOUNTER — Ambulatory Visit: Payer: Medicare HMO | Admitting: Psychology

## 2018-05-01 DIAGNOSIS — F418 Other specified anxiety disorders: Secondary | ICD-10-CM | POA: Diagnosis not present

## 2018-05-01 DIAGNOSIS — R69 Illness, unspecified: Secondary | ICD-10-CM | POA: Diagnosis not present

## 2018-05-05 ENCOUNTER — Telehealth: Payer: Self-pay | Admitting: Family Medicine

## 2018-05-05 DIAGNOSIS — R300 Dysuria: Secondary | ICD-10-CM

## 2018-05-05 NOTE — Telephone Encounter (Signed)
Ref done  Will route to [facility] 

## 2018-05-05 NOTE — Telephone Encounter (Signed)
-----   Message from Josetta Huddle, [place] sent at 05/02/2018  5:22 PM EDT ----- Patient advised.  Patient says she probably prefers [place] but if you prefer someone in [place], she's ok with that.

## 2018-05-06 NOTE — Telephone Encounter (Signed)
Called patient and gave her [facility] info. They will call her directly to schedule, placed on [facility] WQ.

## 2018-05-13 ENCOUNTER — Other Ambulatory Visit: Payer: Self-pay | Admitting: Physician Assistant

## 2018-05-13 DIAGNOSIS — H93A9 Pulsatile tinnitus, unspecified ear: Secondary | ICD-10-CM

## 2018-05-13 DIAGNOSIS — H93A3 Pulsatile tinnitus, bilateral: Secondary | ICD-10-CM | POA: Diagnosis not present

## 2018-05-13 DIAGNOSIS — R0989 Other specified symptoms and signs involving the circulatory and respiratory systems: Secondary | ICD-10-CM | POA: Diagnosis not present

## 2018-05-13 DIAGNOSIS — H6123 Impacted cerumen, bilateral: Secondary | ICD-10-CM | POA: Diagnosis not present

## 2018-05-13 DIAGNOSIS — I729 Aneurysm of unspecified site: Secondary | ICD-10-CM | POA: Diagnosis not present

## 2018-05-20 ENCOUNTER — Ambulatory Visit (INDEPENDENT_AMBULATORY_CARE_PROVIDER_SITE_OTHER): Payer: Medicare HMO | Admitting: Vascular Surgery

## 2018-05-20 ENCOUNTER — Encounter (INDEPENDENT_AMBULATORY_CARE_PROVIDER_SITE_OTHER): Payer: Medicare HMO

## 2018-05-22 ENCOUNTER — Other Ambulatory Visit: Payer: Self-pay | Admitting: Physician Assistant

## 2018-05-22 DIAGNOSIS — H93A9 Pulsatile tinnitus, unspecified ear: Secondary | ICD-10-CM

## 2018-05-23 ENCOUNTER — Ambulatory Visit
Admission: RE | Admit: 2018-05-23 | Discharge: 2018-05-23 | Disposition: A | Discharge status: Home / Self Care | Payer: Medicare HMO | Source: Ambulatory Visit | Attending: Physician Assistant | Admitting: Physician Assistant

## 2018-05-23 DIAGNOSIS — I6623 Occlusion and stenosis of bilateral posterior cerebral arteries: Secondary | ICD-10-CM | POA: Diagnosis not present

## 2018-05-23 DIAGNOSIS — H93A9 Pulsatile tinnitus, unspecified ear: Secondary | ICD-10-CM

## 2018-05-23 DIAGNOSIS — I672 Cerebral atherosclerosis: Secondary | ICD-10-CM | POA: Diagnosis not present

## 2018-05-23 LAB — POCT I-STAT CREATININE: CREATININE: 0.7 mg/dL (ref 0.44–1.00)

## 2018-05-23 MED ORDER — GADOBENATE DIMEGLUMINE 529 MG/ML IV SOLN
15.0000 mL | Freq: Once | INTRAVENOUS | Status: AC | PRN
  Administered 2018-05-23: 13 mL via INTRAVENOUS

## 2018-05-29 ENCOUNTER — Ambulatory Visit (INDEPENDENT_AMBULATORY_CARE_PROVIDER_SITE_OTHER): Payer: Medicare HMO | Admitting: Vascular Surgery

## 2018-05-29 ENCOUNTER — Encounter (INDEPENDENT_AMBULATORY_CARE_PROVIDER_SITE_OTHER): Payer: Medicare HMO

## 2018-05-30 ENCOUNTER — Telehealth (INDEPENDENT_AMBULATORY_CARE_PROVIDER_SITE_OTHER): Payer: Self-pay | Admitting: Vascular Surgery

## 2018-06-02 ENCOUNTER — Inpatient Hospital Stay: Payer: Medicare HMO

## 2018-06-02 ENCOUNTER — Other Ambulatory Visit: Payer: Self-pay

## 2018-06-02 ENCOUNTER — Other Ambulatory Visit: Payer: Self-pay | Admitting: Internal Medicine

## 2018-06-02 ENCOUNTER — Inpatient Hospital Stay: Payer: Medicare HMO | Attending: Internal Medicine

## 2018-06-02 DIAGNOSIS — D751 Secondary polycythemia: Secondary | ICD-10-CM | POA: Diagnosis not present

## 2018-06-02 LAB — CBC WITH DIFFERENTIAL/PLATELET
BASOS ABS: 0.1 10*3/uL (ref 0–0.1)
Basophils Relative: 1 %
Eosinophils Absolute: 0.2 10*3/uL (ref 0–0.7)
Eosinophils Relative: 2 %
HCT: 43.6 % (ref 35.0–47.0)
Hemoglobin: 14.6 g/dL (ref 12.0–16.0)
LYMPHS PCT: 18 %
Lymphs Abs: 1.9 10*3/uL (ref 1.0–3.6)
MCH: 28.9 pg (ref 26.0–34.0)
MCHC: 33.5 g/dL (ref 32.0–36.0)
MCV: 86.3 fL (ref 80.0–100.0)
Monocytes Absolute: 0.6 10*3/uL (ref 0.2–0.9)
Monocytes Relative: 6 %
Neutro Abs: 7.7 10*3/uL — ABNORMAL HIGH (ref 1.4–6.5)
Neutrophils Relative %: 73 %
PLATELETS: 287 10*3/uL (ref 150–440)
RBC: 5.05 MIL/uL (ref 3.80–5.20)
RDW: 13.8 % (ref 11.5–14.5)
WBC: 10.5 10*3/uL (ref 3.6–11.0)

## 2018-06-09 ENCOUNTER — Encounter: Payer: Self-pay | Admitting: Family Medicine

## 2018-06-09 ENCOUNTER — Ambulatory Visit: Payer: Medicare HMO | Admitting: Family Medicine

## 2018-06-09 VITALS — BP 138/68 | HR 82 | Temp 98.4°F | Ht 64.5 in | Wt 136.5 lb

## 2018-06-09 DIAGNOSIS — F17200 Nicotine dependence, unspecified, uncomplicated: Secondary | ICD-10-CM | POA: Diagnosis not present

## 2018-06-09 DIAGNOSIS — I10 Essential (primary) hypertension: Secondary | ICD-10-CM

## 2018-06-09 DIAGNOSIS — F418 Other specified anxiety disorders: Secondary | ICD-10-CM

## 2018-06-09 DIAGNOSIS — G3184 Mild cognitive impairment, so stated: Secondary | ICD-10-CM

## 2018-06-09 DIAGNOSIS — R7309 Other abnormal glucose: Secondary | ICD-10-CM

## 2018-06-09 DIAGNOSIS — R69 Illness, unspecified: Secondary | ICD-10-CM | POA: Diagnosis not present

## 2018-06-09 MED ORDER — DONEPEZIL HCL 10 MG PO TABS: 10.0000 mg | ORAL_TABLET | Freq: Every day | ORAL | 11 refills | Status: DC

## 2018-06-09 NOTE — Assessment & Plan Note (Signed)
Pt is more anxious and irritable/also smoking more  Cognitive change is a big trigger  Did not tolerate zoloft 50 mg due to sedation so we will try changing dose to bedtime and see how she tolerates it  She can take 1/2 pill later in the day if needed Disc imp of self care Did not like counseling unfortunately and will not return  A lot of frustration/anger today on exam  F/u 3 mo or earlier if needed Will refer to neurology as well for her cognitive difficulties

## 2018-06-09 NOTE — Assessment & Plan Note (Signed)
Pt is extremely frustrated/anxious about her symptoms  No side eff from aricept 5 - will inc to 10 soon and see how she does  Rev recent brain MRI/MRA - she will rev with vascular at upcoming appt Ref to neurology  Continue to work on anx/dep with zoloft-change dose to pm  Did not like counseling-will not return

## 2018-06-09 NOTE — Assessment & Plan Note (Signed)
bp in fair control at this time  BP Readings from Last 1 Encounters:  06/09/18 138/68   No changes needed Most recent labs reviewed  Disc lifstyle change with low sodium diet and exercise

## 2018-06-09 NOTE — Patient Instructions (Addendum)
Try the zoloft 50 mg at bedtime instead of am  If that does not work out let me know   Do keep thinking about quitting smoking   We will refer you to neurology for follow up of memory and cognitive issues   Increase your Aricept to 10 mg once daily at bedtime (start when you come back from vacation)  If any side effects or problems drop back to 5 mg and let me know    Follow up with your vascular specialist as planned   Follow up with me in 3 months

## 2018-06-09 NOTE — Assessment & Plan Note (Signed)
Unfortunately smoking more now due to frustration and anxiety/irritability Rev ways to quit  Unsure if she can  Is coughing as well   Disc in detail risks of smoking and possible outcomes including copd, vascular/ heart disease, cancer , respiratory and sinus infections  Pt voices understanding

## 2018-06-09 NOTE — Progress Notes (Signed)
Subjective:    Patient ID: Ann Wu, female    DOB: 04-05-1946, 72 y.o.   MRN: [id number]  HPI  Was seen for mild cognitive impairment with MMS score of 32 last time  Difficulty with concentration/cognition/memory   Thought that poss dep/anx played a role Stopped xanax Inc her zoloft to 50 mg for anx/irritability and also ref to counseling   Could not take 50 all at one time - makes her tired  She divides it twice daily - 1-2 times per week  - does not feel like it helps   Still feels antsy  Stress level is terrible   Started aricept 5 mg  Still having a really a really hard time with word finding and memory  Is very frustrating  Is open to going up to 10 mg     Smoking status - worse-smoking more   Saw Alene Mires for counseling-not helpful at all    Wt Readings from Last 3 Encounters:  06/09/18 136 lb 8 oz (61.9 kg)  04/29/18 141 lb (64 kg)  04/21/18 143 lb 8 oz (65.1 kg)   23.07 kg/m   Had some hot flashes and nausea several days ago  Had to sit in front of the fan  3-10 minutes -then goes away  Worried her a bit  None since Friday    bp is stable today  No cp or palpitations or headaches or edema  No side effects to medicines  BP Readings from Last 3 Encounters:  06/09/18 138/68  04/29/18 (!) 146/76  04/21/18 134/76      Sees vascular soon  MRI /MRA of head done- reviewed   Patient Active Problem List   Diagnosis Date Noted  . Mild cognitive impairment 04/21/2018  . Erythrocytosis 03/11/2018  . Elevated hemoglobin ([place]) 02/27/2018  . Carotid arterial disease ([place]) 11/19/2017  . Subclavian artery stenosis ([place]) 11/19/2017  . Right knee sprain 05/01/2017  . Avulsion fracture of navicular bone of foot 05/01/2017  . Anxiety attack 12/20/2014  . Depression with anxiety 10/09/2011  . Hypertension 09/07/2011  . Other screening mammogram 09/07/2011  . Thoracic aortic aneurysm ([place])   . Aneurysm of aorta ([place]) 08/07/2011  . Back pain 07/24/2011    . HYPERKALEMIA 09/04/2010  . 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 HYPERGLYCEMIA 09/04/2010  . COLONIC POLYPS 03/31/2009  . Hyperlipidemia 03/24/2007  . Smoker 03/24/2007  . ALLERGIC RHINITIS 03/24/2007  . COPD (chronic obstructive pulmonary disease) ([place]) 03/24/2007  . FIBROCYSTIC BREAST DISEASE 03/24/2007  . OSTEOPENIA 03/24/2007  . SLEEP APNEA, MILD 03/24/2007   Past Medical History:  Diagnosis Date  . Allergic rhinitis, seasonal    mild  . Bulging lumbar disc 10/99   L 3-4, L 4-5  . COPD (chronic obstructive pulmonary disease) ([place])    likely per CXR  . Dupuytren's contracture of hand   . Gastritis    EGD 03/2005, 11/10  . Hyperlipidemia   . Hypertension 09/07/2011  . Osteopenia   . Osteopenia    dexa 2003, worse 2006, slighty worse 2010  . Smoker   . Thoracic aortic aneurysm ([place]) 8/12   Past Surgical History:  Procedure Laterality Date  . ABDOMINAL HYSTERECTOMY    . BREAST CYST EXCISION  1980  . CHOLECYSTECTOMY    . cyst aspirations      x 2- breast  . gyn surgery  09/1999   hysterectomy- fibroids   Social History   Tobacco Use  . Smoking status: Current Some Day Smoker  Packs/day: 1.00    Years: 51.00    Pack years: 51.00    Types: Cigarettes  . Smokeless tobacco: Never Used  . Tobacco comment: occasional cigaretts   Substance Use Topics  . Alcohol use: No    Alcohol/week: 0.0 oz  . Drug use: No   Family History  Problem Relation Age of Onset  . Cancer Mother        breast  . Coronary artery disease Mother   . Heart attack Mother   . Stomach cancer Father   . Lung cancer Father   . Cancer Maternal Grandmother 19       " blood and lymph nodes " per pt  . Dementia Maternal Grandfather    Allergies  Allergen Reactions  . Statins Other (See Comments)  . Amoxicillin-Pot Clavulanate   . Codeine     REACTION: nausea and vomiting   Current Outpatient Medications on File Prior to Visit  Medication Sig Dispense Refill  . amLODipine (NORVASC)  10 MG tablet Take 1 tablet (10 mg total) by mouth daily. 30 tablet 11  . aspirin EC 81 MG tablet Take 81 mg by mouth daily.    . cyclobenzaprine (FLEXERIL) 5 MG tablet Take 1 tablet (5 mg total) by mouth 3 (three) times daily as needed. 30 tablet 3  . metoprolol tartrate (LOPRESSOR) 25 MG tablet Take 1 tablet (25 mg total) by mouth daily. 30 tablet 11  . omeprazole (PRILOSEC) 20 MG capsule Take 1 capsule (20 mg total) by mouth daily as needed. 30 capsule 11  . sertraline (ZOLOFT) 50 MG tablet Take 1 tablet (50 mg total) by mouth daily. (Patient taking differently: Take 25 mg by mouth 2 (two) times daily. ) 30 tablet 11  . traMADol (ULTRAM) 50 MG tablet Take 1 tablet (50 mg total) by mouth every 6 (six) hours as needed. 30 tablet 2   No current facility-administered medications on file prior to visit.     Review of Systems  Constitutional: Positive for fatigue. Negative for activity change, appetite change, fever and unexpected weight change.       Fatigue if she takes full dose zoloft in am   HENT: Negative for congestion, ear pain, rhinorrhea, sinus pressure and sore throat.   Eyes: Negative for pain, redness and visual disturbance.  Respiratory: Positive for cough. Negative for shortness of breath and wheezing.        Smokers cough ongoing  Cardiovascular: Negative for chest pain and palpitations.  Gastrointestinal: Negative for abdominal pain, blood in stool, constipation and diarrhea.       Had episode of nausea/ ? Pre syncope that resolved   Endocrine: Negative for polydipsia and polyuria.  Genitourinary: Negative for dysuria, frequency and urgency.  Musculoskeletal: Negative for arthralgias, back pain and myalgias.  Skin: Negative for pallor and rash.  Allergic/Immunologic: Negative for environmental allergies.  Neurological: Negative for dizziness, syncope, light-headedness and headaches.  Hematological: Negative for adenopathy. Does not bruise/bleed easily.  Psychiatric/Behavioral:  Positive for decreased concentration and dysphoric mood. The patient is nervous/anxious.        Short term memory loss  Word finding problems        Objective:   Physical Exam  Constitutional: She appears well-developed and well-nourished. No distress.  Well appearing   HENT:  Head: Normocephalic and atraumatic.  Mouth/Throat: Oropharynx is clear and moist.  Eyes: Pupils are equal, round, and reactive to light. Conjunctivae and EOM are normal. No scleral icterus.  Neck: Normal range of  motion. Neck supple. No JVD present. Carotid bruit is present. No thyromegaly present.  Cardiovascular: Normal rate, regular rhythm, normal heart sounds and intact distal pulses. Exam reveals no gallop.  Pulmonary/Chest: Effort normal and breath sounds normal. No respiratory distress. She has no wheezes. She has no rales.  No crackles  Diffusely distant bs  No wheeze  Not coughing today  Abdominal: Soft. Bowel sounds are normal. She exhibits no distension, no abdominal bruit and no mass. There is no tenderness.  Musculoskeletal: She exhibits no edema.  Lymphadenopathy:    She has no cervical adenopathy.  Neurological: She is alert. She has normal reflexes. She displays normal reflexes. No cranial nerve deficit. She exhibits normal muscle tone. Coordination normal.  Skin: Skin is warm and dry. No rash noted. No pallor.  Psychiatric: Her speech is normal. Thought content normal. Her mood appears anxious. Cognition and memory are impaired. She exhibits a depressed mood. She exhibits abnormal recent memory.  Frustrated about cognitive changes  Some word finding difficulty  At times almost angry about this              Assessment & Plan:   Problem List Items Addressed This Visit      Cardiovascular and Mediastinum   Hypertension    bp in fair control at this time  BP Readings from Last 1 Encounters:  06/09/18 138/68   No changes needed Most recent labs reviewed  Disc lifstyle change  with low sodium diet and exercise          Nervous and Auditory   Mild cognitive impairment - Primary    Pt is extremely frustrated/anxious about her symptoms  No side eff from aricept 5 - will inc to 10 soon and see how she does  Rev recent brain MRI/MRA - she will rev with vascular at upcoming appt Ref to neurology  Continue to work on anx/dep with zoloft-change dose to pm  Did not like counseling-will not return      Relevant Orders   Ambulatory referral to Neurology     Other   Depression with anxiety    Pt is more anxious and irritable/also smoking more  Cognitive change is a big trigger  Did not tolerate zoloft 50 mg due to sedation so we will try changing dose to bedtime and see how she tolerates it  She can take 1/2 pill later in the day if needed Disc imp of self care Did not like counseling unfortunately and will not return  A lot of frustration/anger today on exam  F/u 3 mo or earlier if needed Will refer to neurology as well for her cognitive difficulties       HYPERGLYCEMIA   Smoker    Unfortunately smoking more now due to frustration and anxiety/irritability Rev ways to quit  Unsure if she can  Is coughing as well   Disc in detail risks of smoking and possible outcomes including copd, vascular/ heart disease, cancer , respiratory and sinus infections  Pt voices understanding

## 2018-06-10 ENCOUNTER — Ambulatory Visit (INDEPENDENT_AMBULATORY_CARE_PROVIDER_SITE_OTHER): Payer: Medicare HMO | Admitting: Vascular Surgery

## 2018-06-10 ENCOUNTER — Telehealth: Payer: Self-pay | Admitting: Neurology

## 2018-06-10 ENCOUNTER — Encounter: Payer: Self-pay | Admitting: Neurology

## 2018-06-10 ENCOUNTER — Ambulatory Visit (HOSPITAL_COMMUNITY)
Admission: RE | Admit: 2018-06-10 | Discharge: 2018-06-10 | Disposition: A | Discharge status: Home / Self Care | Payer: Medicare HMO | Source: Ambulatory Visit | Attending: Neurology | Admitting: Neurology

## 2018-06-10 ENCOUNTER — Ambulatory Visit: Payer: Medicare HMO | Admitting: Neurology

## 2018-06-10 ENCOUNTER — Encounter (INDEPENDENT_AMBULATORY_CARE_PROVIDER_SITE_OTHER): Payer: Self-pay | Admitting: Vascular Surgery

## 2018-06-10 VITALS — BP 124/75 | HR 61 | Ht 64.5 in | Wt 136.0 lb

## 2018-06-10 VITALS — BP 133/71 | HR 87 | Resp 13 | Ht 62.0 in | Wt 137.0 lb

## 2018-06-10 DIAGNOSIS — R609 Edema, unspecified: Secondary | ICD-10-CM | POA: Diagnosis not present

## 2018-06-10 DIAGNOSIS — G939 Disorder of brain, unspecified: Secondary | ICD-10-CM | POA: Diagnosis not present

## 2018-06-10 DIAGNOSIS — G319 Degenerative disease of nervous system, unspecified: Secondary | ICD-10-CM | POA: Insufficient documentation

## 2018-06-10 DIAGNOSIS — R4781 Slurred speech: Secondary | ICD-10-CM | POA: Diagnosis not present

## 2018-06-10 DIAGNOSIS — G3184 Mild cognitive impairment, so stated: Secondary | ICD-10-CM | POA: Diagnosis not present

## 2018-06-10 DIAGNOSIS — R4701 Aphasia: Secondary | ICD-10-CM | POA: Insufficient documentation

## 2018-06-10 DIAGNOSIS — I6782 Cerebral ischemia: Secondary | ICD-10-CM | POA: Diagnosis not present

## 2018-06-10 DIAGNOSIS — I6523 Occlusion and stenosis of bilateral carotid arteries: Secondary | ICD-10-CM

## 2018-06-10 DIAGNOSIS — I639 Cerebral infarction, unspecified: Secondary | ICD-10-CM | POA: Insufficient documentation

## 2018-06-10 DIAGNOSIS — I10 Essential (primary) hypertension: Secondary | ICD-10-CM | POA: Diagnosis not present

## 2018-06-10 MED ORDER — GADOBENATE DIMEGLUMINE 529 MG/ML IV SOLN
15.0000 mL | Freq: Once | INTRAVENOUS | Status: AC | PRN
  Administered 2018-06-10: 12 mL via INTRAVENOUS

## 2018-06-10 NOTE — Assessment & Plan Note (Signed)
Now being treated for dementia.  Her carotid disease would not explain the symptoms.

## 2018-06-10 NOTE — Progress Notes (Signed)
PATIENT: Ann Wu DOB: [birth year]  Chief Complaint  Patient presents with  . Memory Loss    MMSE 28/30 - 7 animals.  She is here with her husband, Fulton Reek, to have her worsening memory further evaluated.  She often repeats herself and has word finding difficulty.  Marland Kitchen PCP    Tower, Wynelle Fanny, MD     HISTORICAL  Ann Wu is a 72 years old female, seen in refer by her primary care physician Dr. Glori Bickers, Roque Lias A, for evaluation of memory loss, initial evaluation was on June 10, 2018.  She is accompanied by her husband at today's clinical visit.  I reviewed and summarized the referring note, she had a history of hypertension, is a retired Optometrist,  Around April 2019, she was noted to have subacute onset language difficulty, patient denied comprehensive difficulty, but struggled to find the right word, gradually getting worse over the past couple months, she also complains of frequent bilateral frontal low-grade pressure headaches, she denies lateralized motor or sensory deficit, she tends to gesturing when she talks, apparently very frustrated about her symptoms  I personally reviewed MRA of the neck on May 23, 2018, there was no significant large vessel disease, MRA of the brain showed mild to moderate intracranial atherosclerotic disease, most severe at left cavernous carotid, at the raw imagine of the MRA of the brain, there was noticeable difference at left temporal region, cystic space-occupying lesion was noted.  REVIEW OF SYSTEMS: Full 14 system review of systems performed and notable only for cough, wheezing, snoring, dizziness, anxiety, change in appetite, memory loss, confusion, headaches, dizziness, insomnia, snoring  ALLERGIES: Allergies  Allergen Reactions  . Statins Other (See Comments)  . Amoxicillin-Pot Clavulanate   . Codeine     REACTION: nausea and vomiting    HOME MEDICATIONS: Current Outpatient Medications  Medication Sig Dispense Refill  . amLODipine  (NORVASC) 10 MG tablet Take 1 tablet (10 mg total) by mouth daily. 30 tablet 11  . aspirin EC 81 MG tablet Take 81 mg by mouth daily.    . cyclobenzaprine (FLEXERIL) 5 MG tablet Take 1 tablet (5 mg total) by mouth 3 (three) times daily as needed. 30 tablet 3  . donepezil (ARICEPT) 10 MG tablet Take 1 tablet (10 mg total) by mouth at bedtime. 30 tablet 11  . metoprolol tartrate (LOPRESSOR) 25 MG tablet Take 1 tablet (25 mg total) by mouth daily. 30 tablet 11  . omeprazole (PRILOSEC) 20 MG capsule Take 1 capsule (20 mg total) by mouth daily as needed. 30 capsule 11  . sertraline (ZOLOFT) 50 MG tablet Take 1 tablet (50 mg total) by mouth daily. (Patient taking differently: Take 25 mg by mouth 2 (two) times daily. ) 30 tablet 11  . traMADol (ULTRAM) 50 MG tablet Take 1 tablet (50 mg total) by mouth every 6 (six) hours as needed. 30 tablet 2   No current facility-administered medications for this visit.     PAST MEDICAL HISTORY: Past Medical History:  Diagnosis Date  . Allergic rhinitis, seasonal    mild  . Bulging lumbar disc 10/99   L 3-4, L 4-5  . COPD (chronic obstructive pulmonary disease) ([place])    likely per CXR  . Dupuytren's contracture of hand   . Gastritis    EGD 03/2005, 11/10  . Hyperlipidemia   . Hypertension 09/07/2011  . Memory loss   . Osteopenia   . Osteopenia    dexa 2003, worse 2006, slighty worse  2010  . Smoker   . Thoracic aortic aneurysm ([place]) 8/12    PAST SURGICAL HISTORY: Past Surgical History:  Procedure Laterality Date  . ABDOMINAL HYSTERECTOMY    . BREAST CYST EXCISION  1980  . CHOLECYSTECTOMY    . cyst aspirations      x 2- breast  . gyn surgery  09/1999   hysterectomy- fibroids    FAMILY HISTORY: Family History  Problem Relation Age of Onset  . Cancer Mother        breast  . Coronary artery disease Mother   . Heart attack Mother   . Stomach cancer Father   . Lung cancer Father   . Cancer Maternal Grandmother 75       " blood and lymph  nodes " per pt  . Dementia Maternal Grandfather     SOCIAL HISTORY:  Social History   Socioeconomic History  . Marital status: Married    Spouse name: Not on file  . Number of children: 2  . Years of education: some college  . Highest education level: Not on file  Occupational History  . Occupation: Retired    Fish farm manager: RETIRED    Comment: Arboriculturist  . Financial resource strain: Not on file  . Food insecurity:    Worry: Not on file    Inability: Not on file  . Transportation needs:    Medical: Not on file    Non-medical: Not on file  Tobacco Use  . Smoking status: Current Some Day Smoker    Packs/day: 1.00    Years: 51.00    Pack years: 51.00    Types: Cigarettes  . Smokeless tobacco: Never Used  . Tobacco comment: occasional cigaretts   Substance and Sexual Activity  . Alcohol use: No    Alcohol/week: 0.0 oz  . Drug use: No  . Sexual activity: Not Currently  Lifestyle  . Physical activity:    Days per week: Not on file    Minutes per session: Not on file  . Stress: Not on file  Relationships  . Social connections:    Talks on phone: Not on file    Gets together: Not on file    Attends religious service: Not on file    Active member of club or organization: Not on file    Attends meetings of clubs or organizations: Not on file    Relationship status: Not on file  . Intimate partner violence:    Fear of current or ex partner: Not on file    Emotionally abused: Not on file    Physically abused: Not on file    Forced sexual activity: Not on file  Other Topics Concern  . Not on file  Social History Narrative   Has twin grandchildren.   Lives at home with her husband.   Right-handed.   Occasional caffeine use.     PHYSICAL EXAM   Vitals:   06/10/18 1007  BP: 124/75  Pulse: 61  Weight: 136 lb (61.7 kg)  Height: 5' 4.5" (1.638 m)    Not recorded      Body mass index is 22.98 kg/m.  PHYSICAL EXAMNIATION:  Gen: NAD,  conversant, well nourised, obese, well groomed                     Cardiovascular: Regular rate rhythm, no peripheral edema, warm, nontender. Eyes: Conjunctivae clear without exudates or hemorrhage Neck: Supple, no carotid bruits. Pulmonary: Clear to auscultation  bilaterally   NEUROLOGICAL EXAM:  MMSE - Mini Mental State Exam 06/10/2018  Orientation to time 5  Orientation to Place 5  Registration 3  Attention/ Calculation 5  Recall 1  Language- name 2 objects 2  Language- repeat 1  Language- follow 3 step command 3  Language- read & follow direction 1  Write a sentence 1  Copy design 1  Total score 28  animal naming 7.   Normal comprehension, word finding difficulties, mild expressive aphasia  CRANIAL NERVES: CN II: Visual fields are full to confrontation. Fundoscopic exam is normal with sharp discs and no vascular changes. Pupils are round equal and briskly reactive to light. CN III, IV, VI: extraocular movement are normal. No ptosis. CN V: Facial sensation is intact to pinprick in all 3 divisions bilaterally. Corneal responses are intact.  CN VII: Face is symmetric with normal eye closure and smile. CN VIII: Hearing is normal to rubbing fingers CN IX, X: Palate elevates symmetrically. Phonation is normal. CN XI: Head turning and shoulder shrug are intact CN XII: Tongue is midline with normal movements and no atrophy.  MOTOR: There is no pronator drift of out-stretched arms. Muscle bulk and tone are normal. Muscle strength is normal.  REFLEXES: Reflexes are 2+ and symmetric at the biceps, triceps, knees, and ankles. Plantar responses are flexor.  SENSORY: Intact to light touch, pinprick, positional sensation and vibratory sensation are intact in fingers and toes.  COORDINATION: Rapid alternating movements and fine finger movements are intact. There is no dysmetria on finger-to-nose and heel-knee-shin.    GAIT/STANCE: Posture is normal. Gait is steady with normal steps,  base, arm swing, and turning. Heel and toe walking are normal. Tandem gait is normal.  Romberg is absent.   DIAGNOSTIC DATA (LABS, IMAGING, TESTING) - I reviewed patient records, labs, notes, testing and imaging myself where available.   ASSESSMENT AND PLAN  Ann Wu is a 72 y.o. female   Subacute onset expressive aphasia  Possible space-occupying lesion at the left temporal frontal region  Stat MRI of the brain with without contrast   Marcial Pacas, M.D. Ph.D.  [facility] [address], [place], [place] [address] Ph: [phone] Fax: [phone]  CC: Tower, Wynelle Fanny, MD

## 2018-06-10 NOTE — Telephone Encounter (Signed)
[employer] Josem Kaufmann: [id number] (exp. 06/10/18 to 09/08/18)  Patient is scheduled today at [facility] the patient is aware to arrive at 6:30 pm. They also have Dr. Krista Blue number to call and they will hold the patient until they speak to Dr. Krista Blue. The patient is aware of all this.

## 2018-06-10 NOTE — Assessment & Plan Note (Signed)
blood pressure control important in reducing the progression of atherosclerotic disease. On appropriate oral medications.  

## 2018-06-10 NOTE — Progress Notes (Signed)
MRN : [id number]  Ann Wu is a 72 y.o. (1946-04-14) female who presents with chief complaint of  Chief Complaint  Patient presents with  . Follow-up    Carotid stenosis found by ENT  .  History of Present Illness: Patient returns today in follow up of carotid disease.  She was originally seen for pulsatile tinnitus and found to have mild cervical carotid artery stenosis only about 6 months ago.  Over the past several months, she has been having worsening a aphasia and cognitive issues.  She is now being treated for dementia.  She has had multiple physicians see her including ENT, and recently neurology earlier today.  As part of her work-up she had an MRI of the neck and of the head which I have reviewed.  Her cervical MRI was read out as normal which an MRI would be with less than 40% stenosis.  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  Current Outpatient Medications  Medication Sig Dispense Refill  . amLODipine (NORVASC) 10 MG tablet Take 1 tablet (10 mg total) by mouth daily. 30 tablet 11  . aspirin EC 81 MG tablet Take 81 mg by mouth daily.    . cyclobenzaprine (FLEXERIL) 5 MG tablet Take 1 tablet (5 mg total) by mouth 3 (three) times daily as needed. 30 tablet 3  . donepezil (ARICEPT) 10 MG tablet Take 1 tablet (10 mg total) by mouth at bedtime. 30 tablet 11  . metoprolol tartrate (LOPRESSOR) 25 MG tablet Take 1 tablet (25 mg total) by mouth daily. 30 tablet 11  . omeprazole (PRILOSEC) 20 MG capsule Take 1 capsule (20 mg total) by mouth daily as needed. 30 capsule 11  . sertraline (ZOLOFT) 50 MG tablet Take 1 tablet (50 mg total) by mouth daily. (Patient taking differently: Take 25 mg by mouth 2 (two) times daily. ) 30 tablet 11  . traMADol (ULTRAM) 50 MG tablet Take 1 tablet (50 mg total) by mouth every 6 (six) hours as needed. 30 tablet 2   No current facility-administered medications for this visit.     Past Medical History:  Diagnosis  Date  . Allergic rhinitis, seasonal    mild  . Bulging lumbar disc 10/99   L 3-4, L 4-5  . COPD (chronic obstructive pulmonary disease) ([place])    likely per CXR  . Dupuytren's contracture of hand   . Gastritis    EGD 03/2005, 11/10  . Hyperlipidemia   . Hypertension 09/07/2011  . Memory loss   . Osteopenia   . Osteopenia    dexa 2003, worse 2006, slighty worse 2010  . Smoker   . Thoracic aortic aneurysm ([place]) 8/12    Past Surgical History:  Procedure Laterality Date  . ABDOMINAL HYSTERECTOMY    . BREAST CYST EXCISION  1980  . CHOLECYSTECTOMY    . cyst aspirations      x 2- breast  . gyn surgery  09/1999   hysterectomy- fibroids    Social History Social History   Tobacco Use  . Smoking status: Current Some Day Smoker    Packs/day: 1.00    Years: 51.00    Pack years: 51.00    Types: Cigarettes  . Smokeless tobacco: Never Used  . Tobacco comment: occasional cigaretts   Substance Use Topics  . Alcohol use: No    Alcohol/week: 0.0 oz  . Drug use: No     Family History Family History  Problem Relation Age of  Onset  . Cancer Mother        breast  . Coronary artery disease Mother   . Heart attack Mother   . Stomach cancer Father   . Lung cancer Father   . Cancer Maternal Grandmother 5       " blood and lymph nodes " per pt  . Dementia Maternal Grandfather      Allergies  Allergen Reactions  . Statins Other (See Comments)  . Amoxicillin-Pot Clavulanate   . Codeine     REACTION: nausea and vomiting     REVIEW OF SYSTEMS (Negative unless checked)  Constitutional: [] Weight loss  [] Fever  [] Chills Cardiac: [] Chest pain   [] Chest pressure   [] Palpitations   [] Shortness of breath when laying flat   [] Shortness of breath at rest   [] Shortness of breath with exertion. Vascular:  [] Pain in legs with walking   [] Pain in legs at rest   [] Pain in legs when laying flat   [] Claudication   [] Pain in feet when walking  [] Pain in feet at rest  [] Pain in feet when  laying flat   [] History of DVT   [] Phlebitis   [] Swelling in legs   [] Varicose veins   [] Non-healing ulcers Pulmonary:   [] Uses home oxygen   [] Productive cough   [] Hemoptysis   [] Wheeze  [] COPD   [] Asthma Neurologic:  [] Dizziness  [] Blackouts   [] Seizures   [] History of stroke   [] History of TIA  [x] Aphasia   [] Temporary blindness   [] Dysphagia   [] Weakness or numbness in arms   [] Weakness or numbness in legs Musculoskeletal:  [x] Arthritis   [] Joint swelling   [] Joint pain   [x] Low back pain Hematologic:  [] Easy bruising  [] Easy bleeding   [] Hypercoagulable state   [] Anemic  [] Hepatitis Gastrointestinal:  [] Blood in stool   [] Vomiting blood  [] Gastroesophageal reflux/heartburn   [] Abdominal pain Genitourinary:  [] Chronic kidney disease   [] Difficult urination  [] Frequent urination  [] Burning with urination   [] Hematuria Skin:  [] Rashes   [] Ulcers   [] Wounds Psychological:  [] History of anxiety   []  History of major depression.     Physical Examination  BP 133/71 (BP Location: Right Arm, Patient Position: Sitting)   Pulse 87   Resp 13   Ht 5\' 2"  (1.575 m)   Wt 137 lb (62.1 kg)   BMI 25.06 kg/m  Gen:  WD/WN, NAD Head: [place]/AT, No temporalis wasting. Ear/Nose/Throat: Hearing grossly intact, nares w/o erythema or drainage Eyes: Conjunctiva clear. Sclera non-icteric Neck: Supple.  Trachea midline Pulmonary:  Good air movement, no use of accessory muscles.  Cardiac: RRR, no JVD Vascular:  Vessel Right Left  Radial Palpable Palpable                                    Musculoskeletal: M/S 5/5 throughout.  No deformity or atrophy.  Neurologic: Sensation grossly intact in extremities.  Symmetrical.  Speech is pressured and she forgets many words. Psychiatric: Judgment seems OK, but significant aphasia makes evaluation somewhat difficult Dermatologic: No rashes or ulcers noted.  No cellulitis or open wounds.       Labs Recent Results (from the past 2160 hour(s))  CBC with  Differential     Status: Abnormal   Collection Time: 04/07/18  8:13 AM  Result Value Ref Range   WBC 7.3 3.6 - 11.0 K/uL   RBC 5.68 (H) 3.80 - 5.20 MIL/uL   Hemoglobin  16.5 (H) 12.0 - 16.0 g/dL   HCT 48.3 (H) 35.0 - 47.0 %   MCV 85.1 80.0 - 100.0 fL   MCH 29.1 26.0 - 34.0 pg   MCHC 34.2 32.0 - 36.0 g/dL   RDW 14.0 11.5 - 14.5 %   Platelets 274 150 - 440 K/uL   Neutrophils Relative % 69 %   Neutro Abs 5.1 1.4 - 6.5 K/uL   Lymphocytes Relative 21 %   Lymphs Abs 1.5 1.0 - 3.6 K/uL   Monocytes Relative 6 %   Monocytes Absolute 0.4 0.2 - 0.9 K/uL   Eosinophils Relative 3 %   Eosinophils Absolute 0.2 0 - 0.7 K/uL   Basophils Relative 1 %   Basophils Absolute 0.0 0 - 0.1 K/uL    Comment: Performed at [facility], [address] [place], [place] [address]  CBC with Differential/Platelet     Status: None   Collection Time: 04/28/18  7:58 AM  Result Value Ref Range   WBC 8.7 3.6 - 11.0 K/uL   RBC 5.14 3.80 - 5.20 MIL/uL   Hemoglobin 15.2 12.0 - 16.0 g/dL   HCT 44.1 35.0 - 47.0 %   MCV 85.8 80.0 - 100.0 fL   MCH 29.5 26.0 - 34.0 pg   MCHC 34.4 32.0 - 36.0 g/dL   RDW 14.3 11.5 - 14.5 %   Platelets 282 150 - 440 K/uL   Neutrophils Relative % 74 %   Neutro Abs 6.5 1.4 - 6.5 K/uL   Lymphocytes Relative 17 %   Lymphs Abs 1.5 1.0 - 3.6 K/uL   Monocytes Relative 6 %   Monocytes Absolute 0.5 0.2 - 0.9 K/uL   Eosinophils Relative 2 %   Eosinophils Absolute 0.2 0 - 0.7 K/uL   Basophils Relative 1 %   Basophils Absolute 0.1 0 - 0.1 K/uL    Comment: Performed at [facility], [place],  [address]  POCT Urinalysis Dipstick (Automated)     Status: None   Collection Time: 04/29/18  8:13 AM  Result Value Ref Range   Color, UA Orange    Clarity, UA Clear    Glucose, UA  Negative    Comment: unable to determine due to pt taking AZO   Bilirubin, UA      Comment: unable to determine due to pt taking AZO   Ketones, UA Negative    Spec Grav, UA 1.025 1.010  - 1.025   Blood, UA Negative    pH, UA 6.0 5.0 - 8.0   Protein, UA Negative Negative   Urobilinogen, UA  0.2 or 1.0 E.U./dL    Comment: unable to determine due to pt taking AZO   Nitrite, UA      Comment: unable to determine due to pt taking AZO   Leukocytes, UA Negative Negative  Urine Culture     Status: None   Collection Time: 04/29/18  8:33 AM  Result Value Ref Range   MICRO NUMBER: [id number]    SPECIMEN QUALITY: ADEQUATE    Sample Source URINE    STATUS: FINAL    Result: No Growth   POCT UA - Microscopic Only     Status: Abnormal   Collection Time: 04/30/18 12:29 AM  Result Value Ref Range   WBC, Ur, HPF, POC many    RBC, urine, microscopic many    Bacteria, U Microscopic many    Mucus, UA few    Epithelial cells, urine per micros few  Crystals, Ur, HPF, POC few    Casts, Ur, LPF, POC few    Yeast, UA none   I-STAT creatinine     Status: None   Collection Time: 05/23/18  4:57 PM  Result Value Ref Range   Creatinine, Ser 0.70 0.44 - 1.00 mg/dL  CBC with Differential/Platelet     Status: Abnormal   Collection Time: 06/02/18  8:32 AM  Result Value Ref Range   WBC 10.5 3.6 - 11.0 K/uL   RBC 5.05 3.80 - 5.20 MIL/uL   Hemoglobin 14.6 12.0 - 16.0 g/dL   HCT 43.6 35.0 - 47.0 %   MCV 86.3 80.0 - 100.0 fL   MCH 28.9 26.0 - 34.0 pg   MCHC 33.5 32.0 - 36.0 g/dL   RDW 13.8 11.5 - 14.5 %   Platelets 287 150 - 440 K/uL   Neutrophils Relative % 73 %   Neutro Abs 7.7 (H) 1.4 - 6.5 K/uL   Lymphocytes Relative 18 %   Lymphs Abs 1.9 1.0 - 3.6 K/uL   Monocytes Relative 6 %   Monocytes Absolute 0.6 0.2 - 0.9 K/uL   Eosinophils Relative 2 %   Eosinophils Absolute 0.2 0 - 0.7 K/uL   Basophils Relative 1 %   Basophils Absolute 0.1 0 - 0.1 K/uL    Comment: Performed at [facility], [address], [place], [place] [address]    Radiology Mr Jodene Nam Head Wo Contrast  Result Date: 05/23/2018 CLINICAL DATA:  Pulsatile tinnitus EXAM: MRA HEAD WITHOUT CONTRAST TECHNIQUE:  Angiographic images of the Circle of Willis were obtained using MRA technique without intravenous contrast. COMPARISON:  None. FINDINGS: Both vertebral arteries patent to the basilar. Left vertebral artery dominant. Basilar widely patent. Small PICA patent bilaterally. AICA patent bilaterally. Superior cerebellar and posterior cerebral arteries patent bilaterally. Fetal origin left posterior cerebral artery. Mild stenosis of the posterior cerebral artery bilaterally. Hypoplastic left P1 Atherosclerotic irregularity in the cavernous carotid bilaterally. Moderate stenosis on the left and mild stenosis on the right. Hypoplastic left A1 segment. Both anterior cerebral arteries patent. Anterior and middle cerebral arteries patent bilaterally. Mild stenosis distal left M1 Negative for cerebral aneurysm. IMPRESSION: Mild intracranial atherosclerotic disease. Moderate stenosis left cavernous carotid. Negative for vascular malformation. Electronically Signed   By: Franchot Gallo M.D.   On: 05/23/2018 20:26   Mr Jodene Nam Neck W Wo Contrast  Result Date: 05/23/2018 CLINICAL DATA:  Pulsatile tinnitus EXAM: MRA NECK WITHOUT AND WITH CONTRAST TECHNIQUE: Multiplanar and multiecho pulse sequences of the neck were obtained without and with intravenous contrast. Angiographic images of the neck were obtained using MRA technique without and with intravenous contrast. CONTRAST:  47mL MULTIHANCE GADOBENATE DIMEGLUMINE 529 MG/ML IV SOLN COMPARISON:  None. FINDINGS: Antegrade flow and carotid and vertebral arteries bilaterally. Normal aortic arch and proximal great vessels. Both vertebral arteries patent to the basilar without significant stenosis. Left vertebral artery dominant. Both carotid arteries are patent to the basilar without significant stenosis. No dissection or aneurysm. IMPRESSION: Negative Electronically Signed   By: Franchot Gallo M.D.   On: 05/23/2018 20:28    Assessment/Plan  Hypertension blood pressure control  important in reducing the progression of atherosclerotic disease. On appropriate oral medications.   Mild cognitive impairment Now being treated for dementia.  Her carotid disease would not explain the symptoms.  Carotid arterial disease ([place])  As part of her work-up she had an MRI of the neck and of the head which I have reviewed.  Her cervical MRI  was read out as normal which an MRI would be with less than 40% stenosis.  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  This would be highly unlikely to be the cause of her symptoms, and would not be anything managed surgically or interventionally.  There is very minimal role for intervention for intracranial disease and particularly not mild to moderate intracranial disease.  At this point, I would recommend continued antiplatelet therapy and smoking cessation.  We will recheck carotid duplex in 1 year     Leotis Pain, MD  06/10/2018 5:23 PM    This note was created with Dragon medical transcription system.  Any errors from dictation are purely unintentional

## 2018-06-10 NOTE — Assessment & Plan Note (Addendum)
As part of her work-up she had an MRI of the neck and of the head which I have reviewed.  Her cervical MRI was read out as normal which an MRI would be with less than 40% stenosis.  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  This would be highly unlikely to be the cause of her symptoms, and would not be anything managed surgically or interventionally.  There is very minimal role for intervention for intracranial disease and particularly not mild to moderate intracranial disease.  At this point, I would recommend continued antiplatelet therapy and smoking cessation.  We will recheck carotid duplex in 1 year

## 2018-06-11 ENCOUNTER — Telehealth: Payer: Self-pay | Admitting: Neurology

## 2018-06-11 DIAGNOSIS — R4701 Aphasia: Secondary | ICD-10-CM | POA: Diagnosis not present

## 2018-06-11 DIAGNOSIS — D330 Benign neoplasm of brain, supratentorial: Secondary | ICD-10-CM

## 2018-06-11 DIAGNOSIS — I10 Essential (primary) hypertension: Secondary | ICD-10-CM | POA: Diagnosis not present

## 2018-06-11 DIAGNOSIS — R69 Illness, unspecified: Secondary | ICD-10-CM | POA: Diagnosis not present

## 2018-06-11 DIAGNOSIS — R918 Other nonspecific abnormal finding of lung field: Secondary | ICD-10-CM | POA: Diagnosis not present

## 2018-06-11 DIAGNOSIS — D496 Neoplasm of unspecified behavior of brain: Secondary | ICD-10-CM | POA: Diagnosis not present

## 2018-06-11 DIAGNOSIS — I712 Thoracic aortic aneurysm, without rupture: Secondary | ICD-10-CM | POA: Diagnosis not present

## 2018-06-11 DIAGNOSIS — N281 Cyst of kidney, acquired: Secondary | ICD-10-CM | POA: Diagnosis not present

## 2018-06-11 NOTE — Telephone Encounter (Signed)
Referral sent 

## 2018-06-11 NOTE — Telephone Encounter (Signed)
MRI brain showed  1. 4.7 x 3.5 x 4.0 cm heterogeneous enhancing mass centered at the mesial left temporal lobe, most consistent with high-grade glioma/GBM. Associated vasogenic edema with regional mass effect and up to 4 mm of right-to-left shift. 2. Underlying atrophy with chronic small vessel ischemic disease with scattered remote infarcts as above.  Patient and her husband were called about the MRI brain report.  I have referred her to Dr. Vertell Limber, will have appointment on July 3 10am.

## 2018-06-13 DIAGNOSIS — D496 Neoplasm of unspecified behavior of brain: Secondary | ICD-10-CM | POA: Diagnosis not present

## 2018-06-13 DIAGNOSIS — G936 Cerebral edema: Secondary | ICD-10-CM | POA: Diagnosis not present

## 2018-06-13 DIAGNOSIS — Z01818 Encounter for other preprocedural examination: Secondary | ICD-10-CM | POA: Diagnosis not present

## 2018-06-17 ENCOUNTER — Inpatient Hospital Stay: Payer: Medicare HMO | Admitting: Internal Medicine

## 2018-06-17 ENCOUNTER — Inpatient Hospital Stay: Payer: Medicare HMO

## 2018-06-17 DIAGNOSIS — D496 Neoplasm of unspecified behavior of brain: Secondary | ICD-10-CM | POA: Diagnosis not present

## 2018-06-17 DIAGNOSIS — C712 Malignant neoplasm of temporal lobe: Secondary | ICD-10-CM | POA: Diagnosis not present

## 2018-06-17 DIAGNOSIS — E785 Hyperlipidemia, unspecified: Secondary | ICD-10-CM | POA: Diagnosis not present

## 2018-06-17 DIAGNOSIS — G3184 Mild cognitive impairment, so stated: Secondary | ICD-10-CM | POA: Diagnosis not present

## 2018-06-17 DIAGNOSIS — J449 Chronic obstructive pulmonary disease, unspecified: Secondary | ICD-10-CM | POA: Diagnosis not present

## 2018-06-17 DIAGNOSIS — G4730 Sleep apnea, unspecified: Secondary | ICD-10-CM | POA: Diagnosis not present

## 2018-06-17 DIAGNOSIS — I712 Thoracic aortic aneurysm, without rupture: Secondary | ICD-10-CM | POA: Diagnosis not present

## 2018-06-17 DIAGNOSIS — R69 Illness, unspecified: Secondary | ICD-10-CM | POA: Diagnosis not present

## 2018-06-17 DIAGNOSIS — I10 Essential (primary) hypertension: Secondary | ICD-10-CM | POA: Diagnosis not present

## 2018-06-17 DIAGNOSIS — G8918 Other acute postprocedural pain: Secondary | ICD-10-CM | POA: Diagnosis not present

## 2018-06-17 DIAGNOSIS — C719 Malignant neoplasm of brain, unspecified: Secondary | ICD-10-CM | POA: Diagnosis not present

## 2018-06-17 DIAGNOSIS — G936 Cerebral edema: Secondary | ICD-10-CM | POA: Diagnosis not present

## 2018-06-17 DIAGNOSIS — I708 Atherosclerosis of other arteries: Secondary | ICD-10-CM | POA: Diagnosis not present

## 2018-06-17 DIAGNOSIS — Z9889 Other specified postprocedural states: Secondary | ICD-10-CM | POA: Diagnosis not present

## 2018-06-17 DIAGNOSIS — Z01818 Encounter for other preprocedural examination: Secondary | ICD-10-CM | POA: Diagnosis not present

## 2018-06-18 DIAGNOSIS — C712 Malignant neoplasm of temporal lobe: Secondary | ICD-10-CM | POA: Diagnosis not present

## 2018-06-19 MED ORDER — PANTOPRAZOLE SODIUM 40 MG PO TBEC
40.00 | DELAYED_RELEASE_TABLET | ORAL | Status: DC
Start: 2018-06-20 — End: 2018-06-19

## 2018-06-19 MED ORDER — GENERIC EXTERNAL MEDICATION
Status: DC
Start: ? — End: 2018-06-19

## 2018-06-19 MED ORDER — ACETAMINOPHEN 325 MG PO TABS
975.00 | ORAL_TABLET | ORAL | Status: DC
Start: 2018-06-19 — End: 2018-06-19

## 2018-06-19 MED ORDER — DEXTROSE 50 % IV SOLN
12.50 | INTRAVENOUS | Status: DC
Start: ? — End: 2018-06-19

## 2018-06-19 MED ORDER — LEVETIRACETAM 500 MG PO TABS
1000.00 | ORAL_TABLET | ORAL | Status: DC
Start: 2018-06-19 — End: 2018-06-19

## 2018-06-19 MED ORDER — GENERIC EXTERNAL MEDICATION
1.00 | Status: DC
Start: ? — End: 2018-06-19

## 2018-06-19 MED ORDER — SERTRALINE HCL 50 MG PO TABS
25.00 | ORAL_TABLET | ORAL | Status: DC
Start: 2018-06-19 — End: 2018-06-19

## 2018-06-19 MED ORDER — ONDANSETRON HCL 4 MG/2ML IJ SOLN
4.00 | INTRAMUSCULAR | Status: DC
Start: ? — End: 2018-06-19

## 2018-06-19 MED ORDER — METOPROLOL TARTRATE 25 MG PO TABS
25.00 | ORAL_TABLET | ORAL | Status: DC
Start: 2018-06-20 — End: 2018-06-19

## 2018-06-19 MED ORDER — INSULIN REGULAR HUMAN 100 UNIT/ML IJ SOLN
0.00 | INTRAMUSCULAR | Status: DC
Start: 2018-06-19 — End: 2018-06-19

## 2018-06-19 MED ORDER — BACITRACIN 500 UNIT/GM EX OINT
TOPICAL_OINTMENT | CUTANEOUS | Status: DC
Start: 2018-06-20 — End: 2018-06-19

## 2018-06-19 MED ORDER — PREGABALIN 150 MG PO CAPS
150.00 | ORAL_CAPSULE | ORAL | Status: DC
Start: 2018-06-19 — End: 2018-06-19

## 2018-06-19 MED ORDER — AMLODIPINE BESYLATE 10 MG PO TABS
10.00 | ORAL_TABLET | ORAL | Status: DC
Start: 2018-06-20 — End: 2018-06-19

## 2018-06-19 MED ORDER — OXYCODONE HCL 5 MG PO TABS
5.00 | ORAL_TABLET | ORAL | Status: DC
Start: ? — End: 2018-06-19

## 2018-06-19 MED ORDER — DEXAMETHASONE 4 MG PO TABS
4.00 | ORAL_TABLET | ORAL | Status: DC
Start: 2018-06-19 — End: 2018-06-19

## 2018-06-23 ENCOUNTER — Telehealth: Payer: Self-pay | Admitting: Neurology

## 2018-06-23 ENCOUNTER — Ambulatory Visit: Payer: Self-pay | Admitting: Urology

## 2018-06-23 NOTE — Telephone Encounter (Signed)
Pt's husband called they want to say how much they appreciate Dr Krista Blue and everything she has done for them. He said she had surgery last week and should get the results from the surgery this week. He spoke very highly of Dr Krista Blue and how she got the ball rolling. Again he said how much they appreciate all she did.

## 2018-06-24 ENCOUNTER — Telehealth: Payer: Self-pay | Admitting: Family Medicine

## 2018-06-24 NOTE — Telephone Encounter (Signed)
I spoke with pt's husband;pt was admitted to [facility] on 06/17/18 with brain tumor; pt had surgery on 06/18/18. Waiting now for path report, treatment plan and when treatment will begin. Benny(DPR signed) request cb from Dr Glori Bickers.

## 2018-06-24 NOTE — Telephone Encounter (Signed)
Spoke to pt's husband- she is home and surgery went well - waiting on pathology and plan May need to hold off/re schedule some other appointments- re assured that is ok  Will continue to follow in epic/ care everywhere  Thanked him for updating [place]

## 2018-06-24 NOTE — Telephone Encounter (Signed)
Copied from [place] #[id number]. >> Jun 24, 2018  3:42 PM Burchel, Abbi R wrote: Pt's husband requesting a call back from Dr. Joaquin Music nurse.  He did not wish to disclose details.   Pt's Husband: [phone]

## 2018-07-09 DIAGNOSIS — C712 Malignant neoplasm of temporal lobe: Secondary | ICD-10-CM | POA: Diagnosis not present

## 2018-07-09 DIAGNOSIS — C719 Malignant neoplasm of brain, unspecified: Secondary | ICD-10-CM | POA: Diagnosis not present

## 2018-07-09 DIAGNOSIS — R569 Unspecified convulsions: Secondary | ICD-10-CM | POA: Diagnosis not present

## 2018-07-09 DIAGNOSIS — I6529 Occlusion and stenosis of unspecified carotid artery: Secondary | ICD-10-CM | POA: Diagnosis not present

## 2018-07-09 DIAGNOSIS — R634 Abnormal weight loss: Secondary | ICD-10-CM | POA: Diagnosis not present

## 2018-07-09 DIAGNOSIS — R69 Illness, unspecified: Secondary | ICD-10-CM | POA: Diagnosis not present

## 2018-07-10 DIAGNOSIS — C712 Malignant neoplasm of temporal lobe: Secondary | ICD-10-CM | POA: Diagnosis not present

## 2018-07-10 DIAGNOSIS — R69 Illness, unspecified: Secondary | ICD-10-CM | POA: Diagnosis not present

## 2018-07-15 DIAGNOSIS — C712 Malignant neoplasm of temporal lobe: Secondary | ICD-10-CM | POA: Diagnosis not present

## 2018-07-16 ENCOUNTER — Ambulatory Visit: Payer: Medicare HMO | Admitting: Neurology

## 2018-07-21 DIAGNOSIS — C712 Malignant neoplasm of temporal lobe: Secondary | ICD-10-CM | POA: Diagnosis not present

## 2018-07-22 DIAGNOSIS — C712 Malignant neoplasm of temporal lobe: Secondary | ICD-10-CM | POA: Diagnosis not present

## 2018-07-23 DIAGNOSIS — C712 Malignant neoplasm of temporal lobe: Secondary | ICD-10-CM | POA: Diagnosis not present

## 2018-07-24 DIAGNOSIS — C712 Malignant neoplasm of temporal lobe: Secondary | ICD-10-CM | POA: Diagnosis not present

## 2018-07-25 DIAGNOSIS — C712 Malignant neoplasm of temporal lobe: Secondary | ICD-10-CM | POA: Diagnosis not present

## 2018-07-28 DIAGNOSIS — R69 Illness, unspecified: Secondary | ICD-10-CM | POA: Diagnosis not present

## 2018-07-28 DIAGNOSIS — C712 Malignant neoplasm of temporal lobe: Secondary | ICD-10-CM | POA: Diagnosis not present

## 2018-07-29 DIAGNOSIS — C712 Malignant neoplasm of temporal lobe: Secondary | ICD-10-CM | POA: Diagnosis not present

## 2018-07-31 DIAGNOSIS — C712 Malignant neoplasm of temporal lobe: Secondary | ICD-10-CM | POA: Diagnosis not present

## 2018-08-01 DIAGNOSIS — C712 Malignant neoplasm of temporal lobe: Secondary | ICD-10-CM | POA: Diagnosis not present

## 2018-08-04 DIAGNOSIS — C712 Malignant neoplasm of temporal lobe: Secondary | ICD-10-CM | POA: Diagnosis not present

## 2018-08-05 DIAGNOSIS — C712 Malignant neoplasm of temporal lobe: Secondary | ICD-10-CM | POA: Diagnosis not present

## 2018-08-06 DIAGNOSIS — C712 Malignant neoplasm of temporal lobe: Secondary | ICD-10-CM | POA: Diagnosis not present

## 2018-08-06 DIAGNOSIS — R69 Illness, unspecified: Secondary | ICD-10-CM | POA: Diagnosis not present

## 2018-08-07 DIAGNOSIS — C712 Malignant neoplasm of temporal lobe: Secondary | ICD-10-CM | POA: Diagnosis not present

## 2018-08-08 DIAGNOSIS — C712 Malignant neoplasm of temporal lobe: Secondary | ICD-10-CM | POA: Diagnosis not present

## 2018-08-12 DIAGNOSIS — C712 Malignant neoplasm of temporal lobe: Secondary | ICD-10-CM | POA: Diagnosis not present

## 2018-08-13 ENCOUNTER — Encounter (INDEPENDENT_AMBULATORY_CARE_PROVIDER_SITE_OTHER): Payer: Medicare HMO

## 2018-08-13 ENCOUNTER — Encounter

## 2018-08-13 ENCOUNTER — Ambulatory Visit (INDEPENDENT_AMBULATORY_CARE_PROVIDER_SITE_OTHER): Payer: Medicare HMO | Admitting: Vascular Surgery

## 2018-08-13 DIAGNOSIS — C712 Malignant neoplasm of temporal lobe: Secondary | ICD-10-CM | POA: Diagnosis not present

## 2018-08-14 DIAGNOSIS — C712 Malignant neoplasm of temporal lobe: Secondary | ICD-10-CM | POA: Diagnosis not present

## 2018-08-15 ENCOUNTER — Telehealth: Payer: Self-pay | Admitting: *Deleted

## 2018-08-15 DIAGNOSIS — C712 Malignant neoplasm of temporal lobe: Secondary | ICD-10-CM | POA: Diagnosis not present

## 2018-08-15 NOTE — Telephone Encounter (Signed)
Per v/o to cnl these apts. I contacted the patient. She stated that she felt like this was best to cnl her apts at [facility]. She is coming to [facility] every day and they are checking her there.

## 2018-08-15 NOTE — Telephone Encounter (Signed)
-----   Message from Cammie Sickle, MD sent at 08/15/2018 11:20 AM EDT ----- Regarding: RE: lab  Ann Wu- Patient currently being treated for her brain tumor at [facility].  Hold off any further follow-ups at this time.  Please inform patient that she can follow-up with [place] if she is interested after her treatments at [facility] are over.  Thx GB  ----- Message ----- From: Sabino Gasser, RN Sent: 08/14/2018   3:36 PM EDT To: Starr Sinclair, RN, # Subject: FW: lab                                        Dr. B, could you review this patient's chart. She has seen you in the past for eyrthrocytosis.   What labs do you want. And does she need a therapeutic phelb encounter??   Also, reviewed chart, looks like patient was recently dx with glioblastoma and I dont know if the pt is even coming??  - [name]  ----- Message ----- From: Cephus Richer Sent: 08/13/2018   1:21 PM EDT To: Sabino Gasser, RN, Alric Quan, CMA Subject: lab                                            Hey can you add lab order on for pt. No rush it's not until next week.  Thanks

## 2018-08-18 DIAGNOSIS — C712 Malignant neoplasm of temporal lobe: Secondary | ICD-10-CM | POA: Diagnosis not present

## 2018-08-19 ENCOUNTER — Ambulatory Visit: Payer: Self-pay | Admitting: Internal Medicine

## 2018-08-19 ENCOUNTER — Other Ambulatory Visit: Payer: Self-pay

## 2018-08-19 DIAGNOSIS — C712 Malignant neoplasm of temporal lobe: Secondary | ICD-10-CM | POA: Diagnosis not present

## 2018-08-20 DIAGNOSIS — C712 Malignant neoplasm of temporal lobe: Secondary | ICD-10-CM | POA: Diagnosis not present

## 2018-08-20 DIAGNOSIS — Z510 Encounter for antineoplastic radiation therapy: Secondary | ICD-10-CM | POA: Diagnosis not present

## 2018-08-21 DIAGNOSIS — C712 Malignant neoplasm of temporal lobe: Secondary | ICD-10-CM | POA: Diagnosis not present

## 2018-08-22 DIAGNOSIS — C712 Malignant neoplasm of temporal lobe: Secondary | ICD-10-CM | POA: Diagnosis not present

## 2018-08-25 DIAGNOSIS — C712 Malignant neoplasm of temporal lobe: Secondary | ICD-10-CM | POA: Diagnosis not present

## 2018-08-26 DIAGNOSIS — C712 Malignant neoplasm of temporal lobe: Secondary | ICD-10-CM | POA: Diagnosis not present

## 2018-08-27 DIAGNOSIS — R630 Anorexia: Secondary | ICD-10-CM | POA: Diagnosis not present

## 2018-08-27 DIAGNOSIS — R69 Illness, unspecified: Secondary | ICD-10-CM | POA: Diagnosis not present

## 2018-08-27 DIAGNOSIS — E876 Hypokalemia: Secondary | ICD-10-CM | POA: Diagnosis not present

## 2018-08-27 DIAGNOSIS — R634 Abnormal weight loss: Secondary | ICD-10-CM | POA: Diagnosis not present

## 2018-08-27 DIAGNOSIS — C712 Malignant neoplasm of temporal lobe: Secondary | ICD-10-CM | POA: Diagnosis not present

## 2018-08-27 DIAGNOSIS — Z6821 Body mass index (BMI) 21.0-21.9, adult: Secondary | ICD-10-CM | POA: Diagnosis not present

## 2018-08-28 DIAGNOSIS — C712 Malignant neoplasm of temporal lobe: Secondary | ICD-10-CM | POA: Diagnosis not present

## 2018-08-29 DIAGNOSIS — C712 Malignant neoplasm of temporal lobe: Secondary | ICD-10-CM | POA: Diagnosis not present

## 2018-09-01 DIAGNOSIS — C712 Malignant neoplasm of temporal lobe: Secondary | ICD-10-CM | POA: Diagnosis not present

## 2018-09-02 DIAGNOSIS — C712 Malignant neoplasm of temporal lobe: Secondary | ICD-10-CM | POA: Diagnosis not present

## 2018-09-03 DIAGNOSIS — C712 Malignant neoplasm of temporal lobe: Secondary | ICD-10-CM | POA: Diagnosis not present

## 2018-09-04 DIAGNOSIS — C712 Malignant neoplasm of temporal lobe: Secondary | ICD-10-CM | POA: Diagnosis not present

## 2018-09-05 DIAGNOSIS — C712 Malignant neoplasm of temporal lobe: Secondary | ICD-10-CM | POA: Diagnosis not present

## 2018-09-15 DIAGNOSIS — R630 Anorexia: Secondary | ICD-10-CM | POA: Diagnosis not present

## 2018-09-15 DIAGNOSIS — R5383 Other fatigue: Secondary | ICD-10-CM | POA: Diagnosis not present

## 2018-09-15 DIAGNOSIS — R69 Illness, unspecified: Secondary | ICD-10-CM | POA: Diagnosis not present

## 2018-09-24 DIAGNOSIS — R42 Dizziness and giddiness: Secondary | ICD-10-CM | POA: Diagnosis not present

## 2018-09-24 DIAGNOSIS — R627 Adult failure to thrive: Secondary | ICD-10-CM | POA: Diagnosis not present

## 2018-09-24 DIAGNOSIS — K529 Noninfective gastroenteritis and colitis, unspecified: Secondary | ICD-10-CM | POA: Diagnosis not present

## 2018-09-24 DIAGNOSIS — I10 Essential (primary) hypertension: Secondary | ICD-10-CM | POA: Diagnosis not present

## 2018-09-24 DIAGNOSIS — E876 Hypokalemia: Secondary | ICD-10-CM | POA: Diagnosis not present

## 2018-09-24 DIAGNOSIS — B370 Candidal stomatitis: Secondary | ICD-10-CM | POA: Diagnosis not present

## 2018-09-24 DIAGNOSIS — C719 Malignant neoplasm of brain, unspecified: Secondary | ICD-10-CM | POA: Diagnosis not present

## 2018-09-24 DIAGNOSIS — E43 Unspecified severe protein-calorie malnutrition: Secondary | ICD-10-CM | POA: Diagnosis not present

## 2018-09-24 DIAGNOSIS — Z720 Tobacco use: Secondary | ICD-10-CM | POA: Diagnosis not present

## 2018-09-24 DIAGNOSIS — Z9889 Other specified postprocedural states: Secondary | ICD-10-CM | POA: Diagnosis not present

## 2018-09-24 DIAGNOSIS — R1032 Left lower quadrant pain: Secondary | ICD-10-CM | POA: Diagnosis not present

## 2018-09-24 DIAGNOSIS — R5383 Other fatigue: Secondary | ICD-10-CM | POA: Diagnosis not present

## 2018-09-24 DIAGNOSIS — R638 Other symptoms and signs concerning food and fluid intake: Secondary | ICD-10-CM | POA: Diagnosis not present

## 2018-09-24 DIAGNOSIS — K5900 Constipation, unspecified: Secondary | ICD-10-CM | POA: Diagnosis not present

## 2018-09-24 DIAGNOSIS — K6389 Other specified diseases of intestine: Secondary | ICD-10-CM | POA: Diagnosis not present

## 2018-09-24 DIAGNOSIS — R69 Illness, unspecified: Secondary | ICD-10-CM | POA: Diagnosis not present

## 2018-09-24 DIAGNOSIS — C712 Malignant neoplasm of temporal lobe: Secondary | ICD-10-CM | POA: Diagnosis not present

## 2018-09-24 DIAGNOSIS — J42 Unspecified chronic bronchitis: Secondary | ICD-10-CM | POA: Diagnosis not present

## 2018-09-24 DIAGNOSIS — K5901 Slow transit constipation: Secondary | ICD-10-CM | POA: Diagnosis not present

## 2018-09-24 DIAGNOSIS — R933 Abnormal findings on diagnostic imaging of other parts of digestive tract: Secondary | ICD-10-CM | POA: Diagnosis not present

## 2018-09-24 DIAGNOSIS — I712 Thoracic aortic aneurysm, without rupture: Secondary | ICD-10-CM | POA: Diagnosis not present

## 2018-09-24 DIAGNOSIS — K559 Vascular disorder of intestine, unspecified: Secondary | ICD-10-CM | POA: Diagnosis not present

## 2018-09-24 DIAGNOSIS — R140 Abdominal distension (gaseous): Secondary | ICD-10-CM | POA: Diagnosis not present

## 2018-09-24 DIAGNOSIS — K633 Ulcer of intestine: Secondary | ICD-10-CM | POA: Diagnosis not present

## 2018-09-24 DIAGNOSIS — J449 Chronic obstructive pulmonary disease, unspecified: Secondary | ICD-10-CM | POA: Diagnosis not present

## 2018-09-24 DIAGNOSIS — R110 Nausea: Secondary | ICD-10-CM | POA: Diagnosis not present

## 2018-09-24 DIAGNOSIS — R10814 Left lower quadrant abdominal tenderness: Secondary | ICD-10-CM | POA: Diagnosis not present

## 2018-09-24 DIAGNOSIS — R112 Nausea with vomiting, unspecified: Secondary | ICD-10-CM | POA: Diagnosis not present

## 2018-09-24 DIAGNOSIS — Z85841 Personal history of malignant neoplasm of brain: Secondary | ICD-10-CM | POA: Diagnosis not present

## 2018-09-24 DIAGNOSIS — K644 Residual hemorrhoidal skin tags: Secondary | ICD-10-CM | POA: Diagnosis not present

## 2018-09-24 DIAGNOSIS — R102 Pelvic and perineal pain: Secondary | ICD-10-CM | POA: Diagnosis not present

## 2018-09-24 DIAGNOSIS — R531 Weakness: Secondary | ICD-10-CM | POA: Diagnosis not present

## 2018-09-24 DIAGNOSIS — R634 Abnormal weight loss: Secondary | ICD-10-CM | POA: Diagnosis not present

## 2018-10-02 MED ORDER — FLUCONAZOLE 200 MG PO TABS
200.00 | ORAL_TABLET | ORAL | Status: DC
Start: 2018-10-03 — End: 2018-10-02

## 2018-10-02 MED ORDER — GENERIC EXTERNAL MEDICATION
12.50 | Status: DC
Start: 2018-10-03 — End: 2018-10-02

## 2018-10-02 MED ORDER — LEVETIRACETAM 500 MG PO TABS
500.00 | ORAL_TABLET | ORAL | Status: DC
Start: 2018-10-02 — End: 2018-10-02

## 2018-10-02 MED ORDER — ONDANSETRON HCL 4 MG PO TABS
4.00 | ORAL_TABLET | ORAL | Status: DC
Start: ? — End: 2018-10-02

## 2018-10-02 MED ORDER — ACETAMINOPHEN 325 MG PO TABS
975.00 | ORAL_TABLET | ORAL | Status: DC
Start: 2018-10-02 — End: 2018-10-02

## 2018-10-02 MED ORDER — LIDOCAINE HCL 1 % IJ SOLN
0.50 | INTRAMUSCULAR | Status: DC
Start: ? — End: 2018-10-02

## 2018-10-02 MED ORDER — POLYETHYLENE GLYCOL 3350 17 G PO PACK
17.00 | PACK | ORAL | Status: DC
Start: ? — End: 2018-10-02

## 2018-10-02 MED ORDER — BISACODYL 10 MG RE SUPP
10.00 | RECTAL | Status: DC
Start: ? — End: 2018-10-02

## 2018-10-02 MED ORDER — ALBUTEROL SULFATE HFA 108 (90 BASE) MCG/ACT IN AERS
2.00 | INHALATION_SPRAY | RESPIRATORY_TRACT | Status: DC
Start: ? — End: 2018-10-02

## 2018-10-02 MED ORDER — ONDANSETRON HCL 4 MG/2ML IJ SOLN
4.00 | INTRAMUSCULAR | Status: DC
Start: ? — End: 2018-10-02

## 2018-10-15 ENCOUNTER — Ambulatory Visit: Payer: Medicare HMO | Admitting: Family Medicine

## 2018-10-15 ENCOUNTER — Encounter: Payer: Self-pay | Admitting: Family Medicine

## 2018-10-15 VITALS — BP 106/64 | HR 96 | Temp 98.3°F | Ht 62.0 in | Wt 105.2 lb

## 2018-10-15 DIAGNOSIS — C719 Malignant neoplasm of brain, unspecified: Secondary | ICD-10-CM | POA: Diagnosis not present

## 2018-10-15 DIAGNOSIS — F17200 Nicotine dependence, unspecified, uncomplicated: Secondary | ICD-10-CM

## 2018-10-15 DIAGNOSIS — R627 Adult failure to thrive: Secondary | ICD-10-CM

## 2018-10-15 DIAGNOSIS — B370 Candidal stomatitis: Secondary | ICD-10-CM

## 2018-10-15 DIAGNOSIS — R69 Illness, unspecified: Secondary | ICD-10-CM | POA: Diagnosis not present

## 2018-10-15 DIAGNOSIS — R197 Diarrhea, unspecified: Secondary | ICD-10-CM | POA: Diagnosis not present

## 2018-10-15 MED ORDER — CLOTRIMAZOLE 10 MG MT TROC: 10.0000 mg | Freq: Every day | OROMUCOSAL | 1 refills | Status: AC

## 2018-10-15 MED ORDER — FLUCONAZOLE 200 MG PO TABS: 200.0000 mg | ORAL_TABLET | Freq: Every day | ORAL | 0 refills | Status: AC

## 2018-10-15 NOTE — Patient Instructions (Signed)
Take the fluconazole daily for 5 more days  use the clotrimazole troche (dissolves in mouth) 5 times daily -do your best   Alert me if not starting to improve in the next week   Testing stool for c diff today   Small meals or snacks frequently - keep working on intake

## 2018-10-15 NOTE — Progress Notes (Signed)
Subjective:    Patient ID: Ann Wu, female    DOB: 1946-09-08, 72 y.o.   MRN: [id number]  HPI Here for c/o diarrhea and thrush   In tx for brain tumor/cancer currently   Has been using nystatin mouthwash for over a month  IV fluconazole followed by oral   Smoking status   Was hosp in oct for colitis/n/v  Fail to thrive  Had flex sig- found ischemic area and large stool impaction  Had 5 d of abx  Also IV fluconazole for thrush   Originally tongue started to improve with the diflucan -then got worse again  Makes her not want to eat   Supposed to re start tx -but cannot until she gains weight  Broth/soup and oatmeal Drinking carnation inst breakfast   Yesterday some epigastric pain  Feels like she gets full very quickly    Diarrhea - (stopped senekot tues)  Diarrhea worse Sunday/monday -- bm every 15-30 min  Watery but not mucous  Yesterday took an immoduim- this did help  No blood in stool   Patient Active Problem List   Diagnosis Date Noted  . Failure to thrive in adult 10/15/2018  . Diarrhea 10/15/2018  . Thrush 10/15/2018  . Glioblastoma ([place]) 10/15/2018  . Aphasia 06/10/2018  . Mild cognitive impairment 04/21/2018  . Erythrocytosis 03/11/2018  . Elevated hemoglobin ([place]) 02/27/2018  . Carotid arterial disease ([place]) 11/19/2017  . 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 Right knee sprain 05/01/2017  . Avulsion fracture of navicular bone of foot 05/01/2017  . Anxiety attack 12/20/2014  . Depression with anxiety 10/09/2011  . Hypertension 09/07/2011  . Other screening mammogram 09/07/2011  . Thoracic aortic aneurysm ([place])   . Aneurysm of aorta ([place]) 08/07/2011  . Back pain 07/24/2011  . HYPERKALEMIA 09/04/2010  . 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 HYPERGLYCEMIA 09/04/2010  . COLONIC POLYPS 03/31/2009  . Hyperlipidemia 03/24/2007  . Smoker 03/24/2007  . ALLERGIC RHINITIS 03/24/2007  . COPD (chronic obstructive pulmonary disease) ([place])  03/24/2007  . FIBROCYSTIC BREAST DISEASE 03/24/2007  . OSTEOPENIA 03/24/2007  . SLEEP APNEA, MILD 03/24/2007   Past Medical History:  Diagnosis Date  . Allergic rhinitis, seasonal    mild  . Bulging lumbar disc 10/99   L 3-4, L 4-5  . COPD (chronic obstructive pulmonary disease) ([place])    likely per CXR  . Dupuytren's contracture of hand   . Gastritis    EGD 03/2005, 11/10  . Hyperlipidemia   . Hypertension 09/07/2011  . Memory loss   . Osteopenia   . Osteopenia    dexa 2003, worse 2006, slighty worse 2010  . Smoker   . Thoracic aortic aneurysm ([place]) 8/12   Past Surgical History:  Procedure Laterality Date  . ABDOMINAL HYSTERECTOMY    . BREAST CYST EXCISION  1980  . CHOLECYSTECTOMY    . cyst aspirations      x 2- breast  . gyn surgery  09/1999   hysterectomy- fibroids   Social History   Tobacco Use  . Smoking status: Current Some Day Smoker    Packs/day: 1.00    Years: 51.00    Pack years: 51.00    Types: Cigarettes  . Smokeless tobacco: Never Used  . Tobacco comment: occasional cigaretts   Substance Use Topics  . Alcohol use: No    Alcohol/week: 0.0 standard drinks  . Drug use: No   Family History  Problem Relation Age of Onset  . Cancer Mother  breast  . Coronary artery disease Mother   . Heart attack Mother   . Stomach cancer Father   . Lung cancer Father   . Cancer Maternal Grandmother 57       " blood and lymph nodes " per pt  . Dementia Maternal Grandfather    Allergies  Allergen Reactions  . Statins Other (See Comments)  . Amoxicillin-Pot Clavulanate   . Codeine     REACTION: nausea and vomiting   Current Outpatient Medications on File Prior to Visit  Medication Sig Dispense Refill  . acetaminophen (TYLENOL) 325 MG tablet Take 325-650 mg by mouth every 6 (six) hours as needed.    . levETIRAcetam (KEPPRA) 500 MG tablet Take 1 tablet by mouth 2 (two) times daily.    . Loperamide HCl (IMODIUM PO) Take by mouth daily as needed.    .  metoprolol tartrate (LOPRESSOR) 25 MG tablet Take 0.5 tablets by mouth daily.    Marland Kitchen senna (SENOKOT) 8.6 MG tablet Take 1 tablet by mouth daily as needed.    . sertraline (ZOLOFT) 50 MG tablet Take 0.5 tablets by mouth 2 (two) times daily.     No current facility-administered medications on file prior to visit.      Review of Systems  Constitutional: Positive for activity change, appetite change and fatigue. Negative for fever and unexpected weight change.  HENT: Positive for mouth sores. Negative for congestion, ear pain, postnasal drip, rhinorrhea, sinus pressure and sore throat.   Eyes: Negative for pain, redness and visual disturbance.  Respiratory: Negative for cough, shortness of breath and wheezing.   Cardiovascular: Negative for chest pain and palpitations.  Gastrointestinal: Positive for abdominal pain and diarrhea. Negative for abdominal distention, anal bleeding, blood in stool, constipation, nausea, rectal pain and vomiting.  Endocrine: Negative for polydipsia and polyuria.  Genitourinary: Negative for dysuria, frequency and urgency.  Musculoskeletal: Negative for arthralgias, back pain and myalgias.  Skin: Negative for pallor and rash.  Allergic/Immunologic: Negative for environmental allergies.  Neurological: Negative for dizziness, seizures, syncope, light-headedness, numbness and headaches.       Word finding- is improved  Hematological: Negative for adenopathy. Does not bruise/bleed easily.  Psychiatric/Behavioral: Positive for decreased concentration. Negative for dysphoric mood. The patient is not nervous/anxious.        Objective:   Physical Exam  Constitutional: She appears well-developed and well-nourished. No distress.  Much weight loss noted cachexic  HENT:  Head: Normocephalic.  Nose: Nose normal.  Large crescent shaped scar L scalp with scabbing  Thick pale coating on tongue (without ulcers)  Not able to scrape off with tongue blade  Eyes: Pupils are  equal, round, and reactive to light. Conjunctivae and EOM are normal. No scleral icterus.  Neck: Normal range of motion. Neck supple. No JVD present. No tracheal deviation present. No thyromegaly present.  Cardiovascular: Regular rhythm, normal heart sounds and intact distal pulses.  No murmur heard. Pulmonary/Chest: Effort normal and breath sounds normal. No respiratory distress. She has no wheezes.  Diffusely distant bs   Abdominal: Soft. Bowel sounds are normal. She exhibits no distension and no mass. There is tenderness. There is no rebound and no guarding. No hernia.  Very mild epigastric tenderness on deep palpation  No rebound or guarding Nl bs 4 Q  Musculoskeletal: Normal range of motion. She exhibits no edema.  Lymphadenopathy:    She has no cervical adenopathy.  Neurological: She is alert. No cranial nerve deficit. She exhibits normal muscle tone. Coordination normal.  Skin: Skin is warm and dry. No rash noted. No erythema.  Psychiatric: She has a normal mood and affect.  Pleasant           Assessment & Plan:   Problem List Items Addressed This Visit      Digestive   Thrush    Difficult to treat (s/p cancer treatment as well as hosp with abx for colitis)  Px 5 more days of fluconazole 200 mg daily po Also clotrimazole troche- 5 times daily (as well as she can comply)  Update if not starting to improve in a week or if worsening        Relevant Medications   fluconazole (DIFLUCAN) 200 MG tablet   clotrimazole (MYCELEX) 10 MG troche     Nervous and Auditory   Glioblastoma ([place])    S/p surgery  Seen at [facility] On break from tx currently (recent hosp for colitis)  Reviewed hospital records, lab results and studies in detail         Relevant Medications   fluconazole (DIFLUCAN) 200 MG tablet     Other   Diarrhea    Since hospitalization and tx for colitis Reviewed hospital records, lab results and studies in detail  Was on abx  Watching closely for  dehydration-disc plan for fluid intake  Check c diff today  Pt has f/u with [facility] next week       Relevant Orders   C. difficile GDH and Toxin A/B   Failure to thrive in adult    S/p and in the midst of tx for brain tumor  Rev [facility] notes from care everywhere along with last hospital stay (including colitis and mesenteric ischemia) Anorexia continues- appetite waxes and wanes / pt finds it difficult to eat In addition she fills up fast (a few bites) Disc strategy for calorie intake in small amounts frequently incl supplement shakes (low residue)  Also fluids  F/u at [facility] next week as planned       Smoker    Continue to enc pt to quit   Disc in detail risks of smoking and possible outcomes including copd, vascular/ heart disease, cancer , respiratory and sinus infections  Pt voices understanding

## 2018-10-15 NOTE — Assessment & Plan Note (Signed)
Difficult to treat (s/p cancer treatment as well as hosp with abx for colitis)  Px 5 more days of fluconazole 200 mg daily po Also clotrimazole troche- 5 times daily (as well as she can comply)  Update if not starting to improve in a week or if worsening

## 2018-10-15 NOTE — Assessment & Plan Note (Signed)
S/p surgery  Seen at [facility] On break from tx currently (recent hosp for colitis)  Reviewed hospital records, lab results and studies in detail

## 2018-10-15 NOTE — Assessment & Plan Note (Signed)
Continue to enc pt to quit   Disc in detail risks of smoking and possible outcomes including copd, vascular/ heart disease, cancer , respiratory and sinus infections  Pt voices understanding

## 2018-10-15 NOTE — Assessment & Plan Note (Signed)
S/p and in the midst of tx for brain tumor  Rev [facility] notes from care everywhere along with last hospital stay (including colitis and mesenteric ischemia) Anorexia continues- appetite waxes and wanes / pt finds it difficult to eat In addition she fills up fast (a few bites) Disc strategy for calorie intake in small amounts frequently incl supplement shakes (low residue)  Also fluids  F/u at [facility] next week as planned

## 2018-10-15 NOTE — Assessment & Plan Note (Signed)
Since hospitalization and tx for colitis Reviewed hospital records, lab results and studies in detail  Was on abx  Watching closely for dehydration-disc plan for fluid intake  Check c diff today  Pt has f/u with [facility] next week

## 2018-10-16 DIAGNOSIS — R197 Diarrhea, unspecified: Secondary | ICD-10-CM | POA: Diagnosis not present

## 2018-10-17 ENCOUNTER — Telehealth: Payer: Self-pay

## 2018-10-17 LAB — C. DIFFICILE GDH AND TOXIN A/B
GDH ANTIGEN: NOT DETECTED
MICRO NUMBER:: 91342693
SPECIMEN QUALITY:: ADEQUATE
TOXIN A AND B: NOT DETECTED

## 2018-10-17 MED ORDER — DIPHENOXYLATE-ATROPINE 2.5-0.025 MG PO TABS: 1.0000 | ORAL_TABLET | Freq: Three times a day (TID) | ORAL | 0 refills | Status: AC | PRN

## 2018-10-17 MED ORDER — METRONIDAZOLE 500 MG PO TABS: 500.0000 mg | ORAL_TABLET | Freq: Three times a day (TID) | ORAL | 0 refills | Status: AC

## 2018-10-17 NOTE — Telephone Encounter (Signed)
Patient's daughter returned [place]'s call.  I spoke to patient's daughter and let her know Dr.Tower's instructions.  She voiced understanding.

## 2018-10-17 NOTE — Telephone Encounter (Signed)
Left VM requesting pt's daughter to call the office back 

## 2018-10-17 NOTE — Telephone Encounter (Signed)
Cissy pts daughter (DPR signed) said on 10/16/18 had watery diarrhea 3-4 x and last night pt had watery diarrhea with some consistency 5 times. Pt has been taking immodium with no relief. Pt last seen 10/15/18 by Dr Glori Bickers. Pt does not have fever. Stool specimen for c diff was brought to [facility] lab on 10/16/18. Cissy said pt cannot go thru weekend like this and needs med for diarrhea sent to [facility]. Cissy request cb.

## 2018-10-17 NOTE — Telephone Encounter (Signed)
Waiting on the c diff test I want to go ahead and send in flagyl to start just in case it is positive   Also lomotil for diarrhea- this is strong so please use sparingly (also sedating)   I sent to [facility] court drug Watch for s/s of dehydration   Keep up fluid intake

## 2018-10-21 DIAGNOSIS — C712 Malignant neoplasm of temporal lobe: Secondary | ICD-10-CM | POA: Diagnosis not present

## 2018-10-22 DIAGNOSIS — K559 Vascular disorder of intestine, unspecified: Secondary | ICD-10-CM | POA: Diagnosis not present

## 2018-10-22 DIAGNOSIS — E43 Unspecified severe protein-calorie malnutrition: Secondary | ICD-10-CM | POA: Diagnosis not present

## 2018-10-22 DIAGNOSIS — R112 Nausea with vomiting, unspecified: Secondary | ICD-10-CM | POA: Diagnosis not present

## 2018-10-22 DIAGNOSIS — R1032 Left lower quadrant pain: Secondary | ICD-10-CM | POA: Diagnosis not present

## 2018-10-22 DIAGNOSIS — K5939 Other megacolon: Secondary | ICD-10-CM | POA: Diagnosis not present

## 2018-10-22 DIAGNOSIS — K2950 Unspecified chronic gastritis without bleeding: Secondary | ICD-10-CM | POA: Diagnosis not present

## 2018-10-22 DIAGNOSIS — R0789 Other chest pain: Secondary | ICD-10-CM | POA: Diagnosis not present

## 2018-10-22 DIAGNOSIS — R634 Abnormal weight loss: Secondary | ICD-10-CM | POA: Diagnosis not present

## 2018-10-22 DIAGNOSIS — R918 Other nonspecific abnormal finding of lung field: Secondary | ICD-10-CM | POA: Diagnosis not present

## 2018-10-22 DIAGNOSIS — Z4659 Encounter for fitting and adjustment of other gastrointestinal appliance and device: Secondary | ICD-10-CM | POA: Diagnosis not present

## 2018-10-22 DIAGNOSIS — I10 Essential (primary) hypertension: Secondary | ICD-10-CM | POA: Diagnosis not present

## 2018-10-22 DIAGNOSIS — K5900 Constipation, unspecified: Secondary | ICD-10-CM | POA: Diagnosis not present

## 2018-10-22 DIAGNOSIS — R5383 Other fatigue: Secondary | ICD-10-CM | POA: Diagnosis not present

## 2018-10-22 DIAGNOSIS — J449 Chronic obstructive pulmonary disease, unspecified: Secondary | ICD-10-CM | POA: Diagnosis not present

## 2018-10-22 DIAGNOSIS — R638 Other symptoms and signs concerning food and fluid intake: Secondary | ICD-10-CM | POA: Diagnosis not present

## 2018-10-22 DIAGNOSIS — E785 Hyperlipidemia, unspecified: Secondary | ICD-10-CM | POA: Diagnosis not present

## 2018-10-22 DIAGNOSIS — C712 Malignant neoplasm of temporal lobe: Secondary | ICD-10-CM | POA: Diagnosis not present

## 2018-10-22 DIAGNOSIS — K633 Ulcer of intestine: Secondary | ICD-10-CM | POA: Diagnosis not present

## 2018-10-22 DIAGNOSIS — R627 Adult failure to thrive: Secondary | ICD-10-CM | POA: Diagnosis not present

## 2018-10-22 DIAGNOSIS — R748 Abnormal levels of other serum enzymes: Secondary | ICD-10-CM | POA: Diagnosis not present

## 2018-10-22 DIAGNOSIS — R000 Tachycardia, unspecified: Secondary | ICD-10-CM | POA: Diagnosis not present

## 2018-10-22 DIAGNOSIS — R630 Anorexia: Secondary | ICD-10-CM | POA: Diagnosis not present

## 2018-10-22 DIAGNOSIS — R0602 Shortness of breath: Secondary | ICD-10-CM | POA: Diagnosis not present

## 2018-10-22 DIAGNOSIS — I719 Aortic aneurysm of unspecified site, without rupture: Secondary | ICD-10-CM | POA: Diagnosis not present

## 2018-10-22 DIAGNOSIS — R109 Unspecified abdominal pain: Secondary | ICD-10-CM | POA: Diagnosis not present

## 2018-10-22 DIAGNOSIS — K3189 Other diseases of stomach and duodenum: Secondary | ICD-10-CM | POA: Diagnosis not present

## 2018-10-22 DIAGNOSIS — C719 Malignant neoplasm of brain, unspecified: Secondary | ICD-10-CM | POA: Diagnosis not present

## 2018-10-22 DIAGNOSIS — K766 Portal hypertension: Secondary | ICD-10-CM | POA: Diagnosis not present

## 2018-10-22 DIAGNOSIS — K838 Other specified diseases of biliary tract: Secondary | ICD-10-CM | POA: Diagnosis not present

## 2018-10-22 DIAGNOSIS — Z720 Tobacco use: Secondary | ICD-10-CM | POA: Diagnosis not present

## 2018-10-22 DIAGNOSIS — Z681 Body mass index (BMI) 19 or less, adult: Secondary | ICD-10-CM | POA: Diagnosis not present

## 2018-10-22 DIAGNOSIS — R6881 Early satiety: Secondary | ICD-10-CM | POA: Diagnosis not present

## 2018-10-22 DIAGNOSIS — I712 Thoracic aortic aneurysm, without rupture: Secondary | ICD-10-CM | POA: Diagnosis not present

## 2018-10-22 IMAGING — MR MR MRA NECK WO/W CM
5 series · 44 of 48 positions shown · IV contrast (multihance)
Comparison: None.

CLINICAL DATA: Pulsatile tinnitus

EXAM:
MRA NECK WITHOUT AND WITH CONTRAST
TECHNIQUE: Multiplanar and multiecho pulse sequences of the neck were obtained
without and with intravenous contrast. Angiographic images of the
neck were obtained using MRA technique without and with intravenous
contrast.
CONTRAST:  13mL MULTIHANCE GADOBENATE DIMEGLUMINE 529 MG/ML IV SOLN

[Series 3: (id) · axial · 1.0mm · 0.52mm/px · z∈[-7,+108]mm · 13 of 116 slices shown]
[im 1/116]
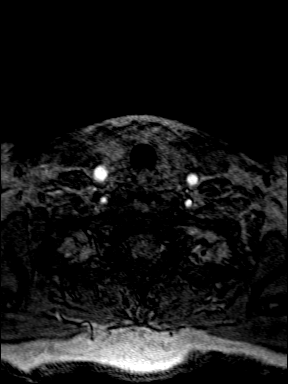
[im 10/116]
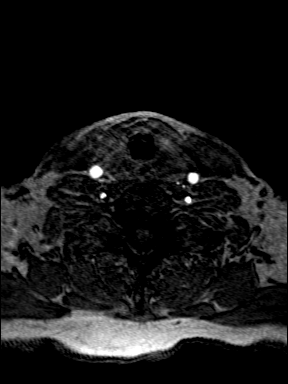
[im 20/116]
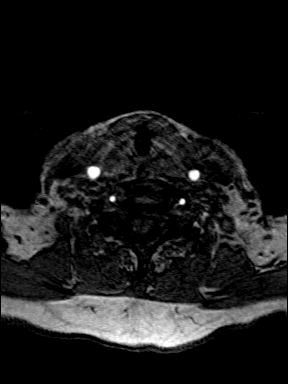
[im 29/116]
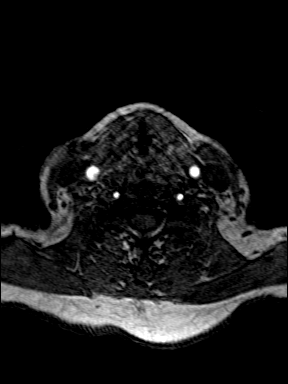
[im 39/116]
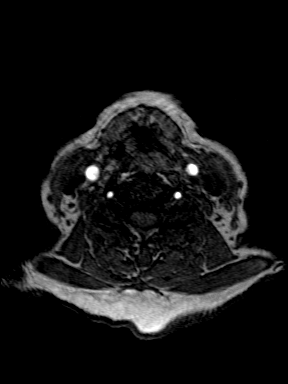
[im 48/116]
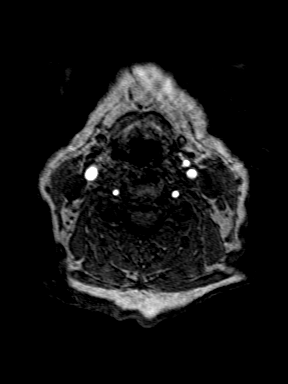
[im 58/116]
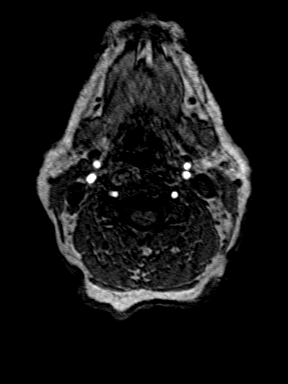
[im 68/116]
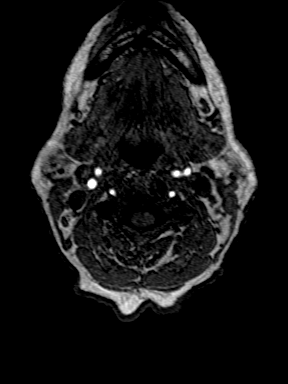
[im 77/116]
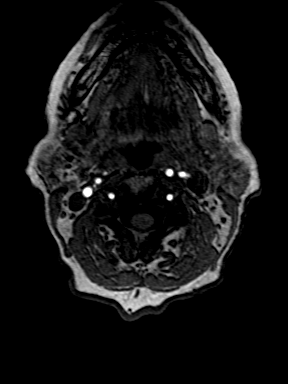
[im 87/116]
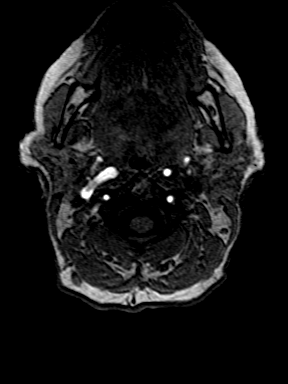
[im 96/116]
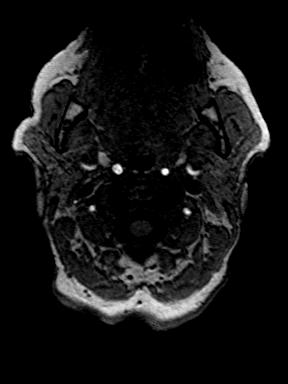
[im 106/116]
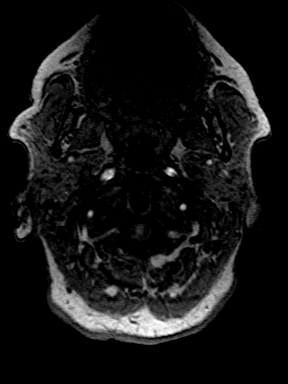
[im 116/116]
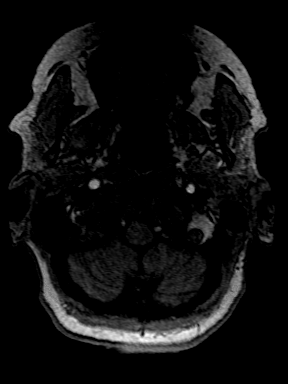

[Series 7: MRA · coronal · 0.7mm · 0.84mm/px · 10 of 88 slices shown (1 of 3)]
[im 1/88]
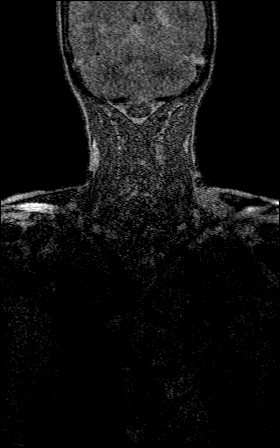
[im 10/88]
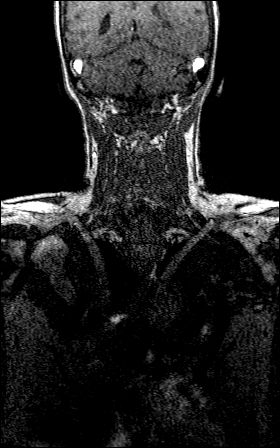
[im 20/88]
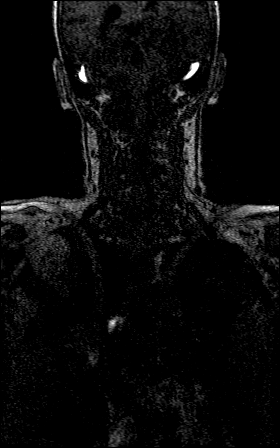
[im 30/88]
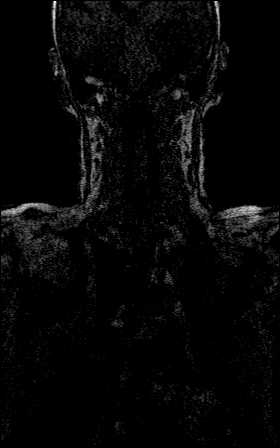
[im 39/88]
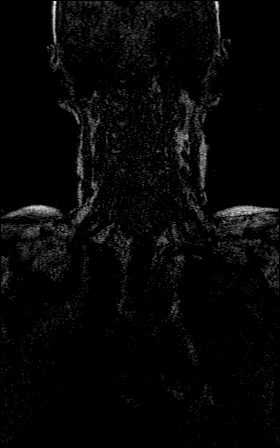
[im 49/88]
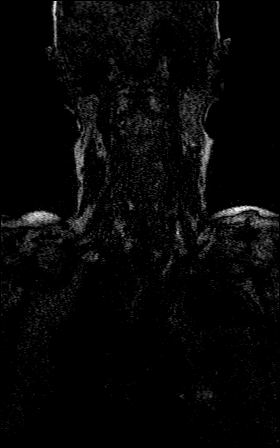
[im 59/88]
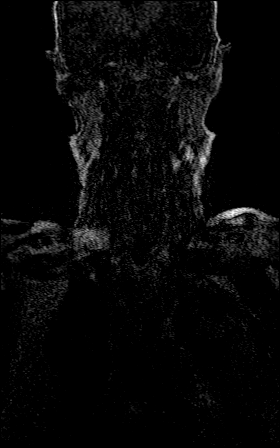
[im 68/88]
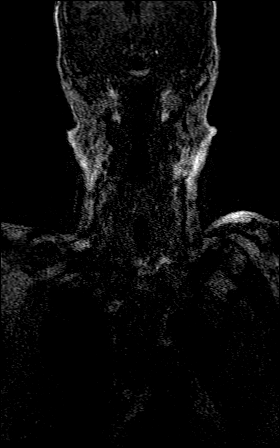
[im 78/88]
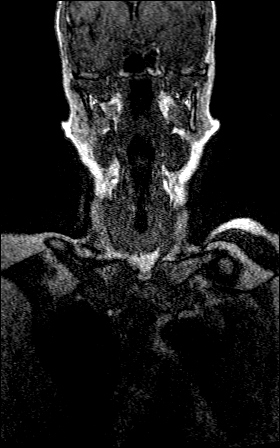
[im 88/88]
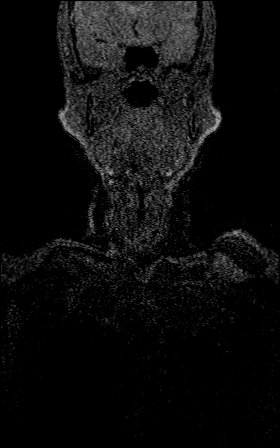

[Series 9: MRA · coronal · 0.7mm · 0.84mm/px · 8 of 86 slices shown (2 of 3)]
[im 1/86]
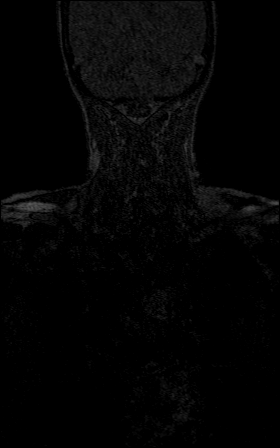
[im 10/86]
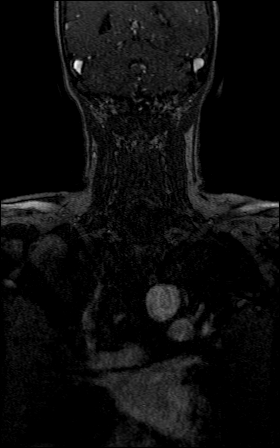
[im 29/86]
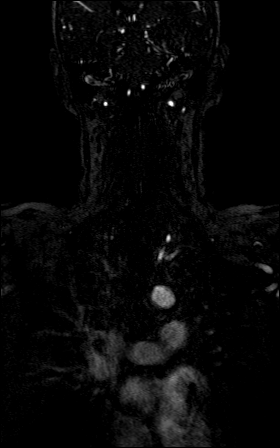
[im 38/86]
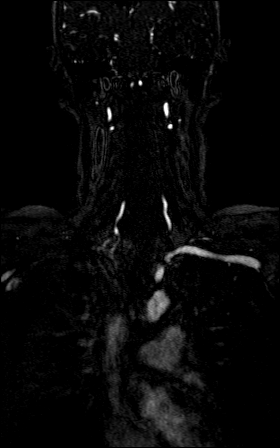
[im 48/86]
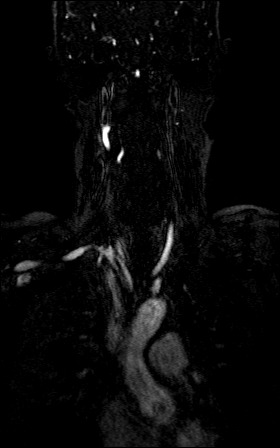
[im 57/86]
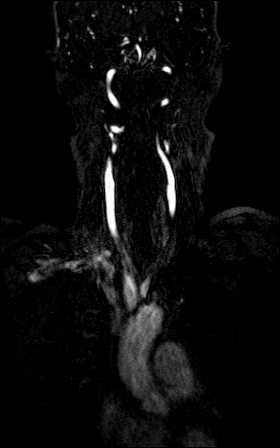
[im 76/86]
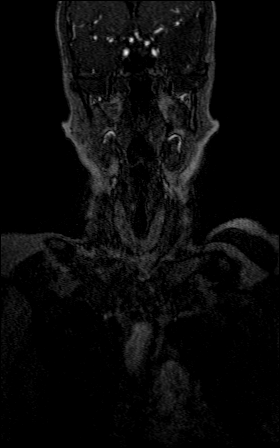
[im 86/86]
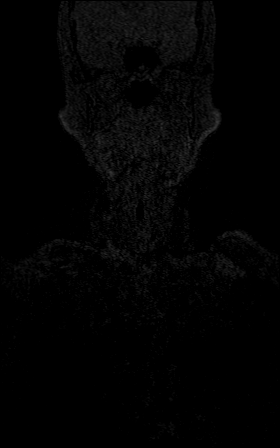

[Series 10: MRA · coronal · 0.7mm · 0.84mm/px · 8 of 83 slices shown (3 of 3)]
[im 1/83]
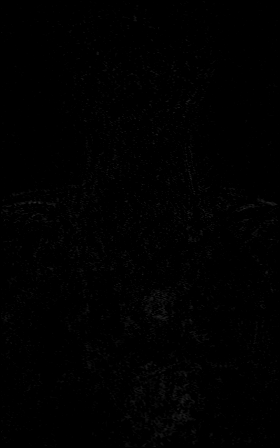
[im 10/83]
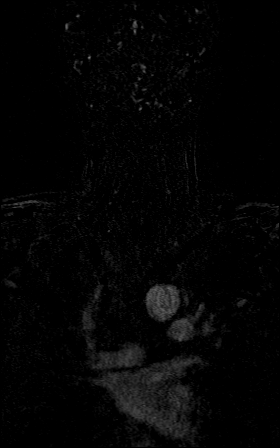
[im 28/83]
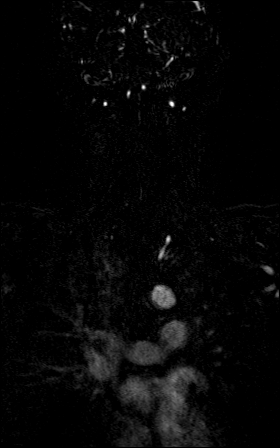
[im 37/83]
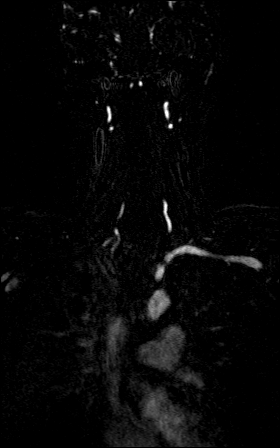
[im 46/83]
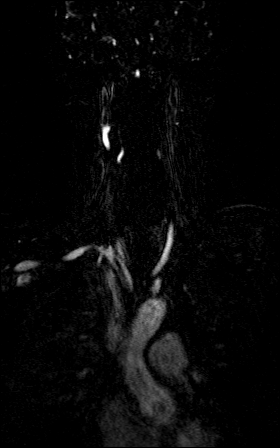
[im 55/83]
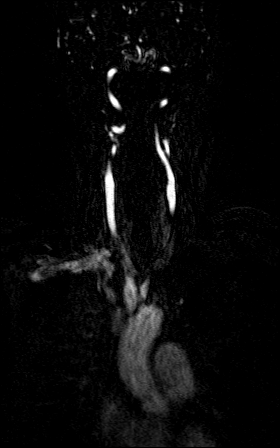
[im 73/83]
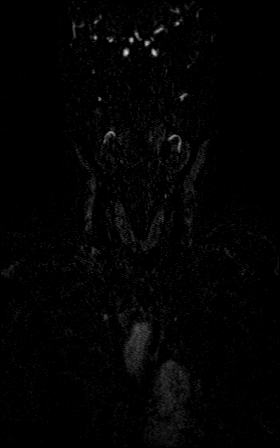
[im 83/83]
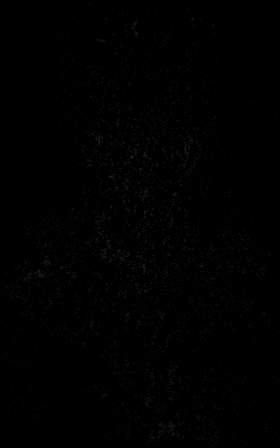

[Series 12: T1 fat-sat · axial · 3.0mm · 0.70mm/px · z∈[-34,+118]mm · 5 of 40 slices shown]
[im 1/40]
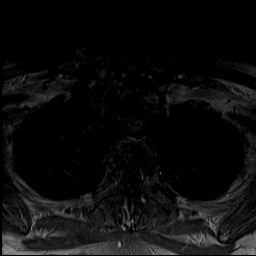
[im 10/40]
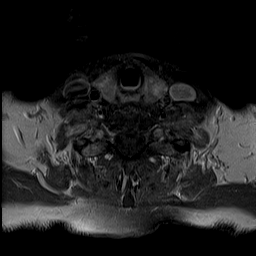
[im 20/40]
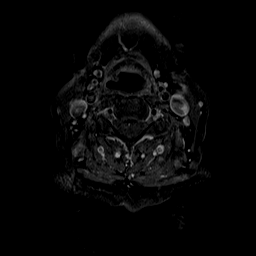
[im 30/40]
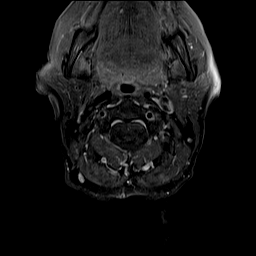
[im 40/40]
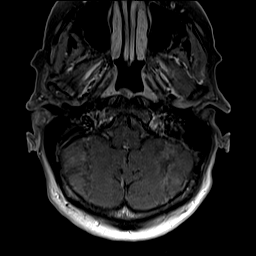

[44 of 48 positions shown; findings below may reference images not displayed]

FINDINGS: Antegrade flow and carotid and vertebral arteries bilaterally.

Normal aortic arch and proximal great vessels.

Both vertebral arteries patent to the basilar without significant
stenosis. Left vertebral artery dominant.

Both carotid arteries are patent to the basilar without significant
stenosis. No dissection or aneurysm.
IMPRESSION: Negative

## 2018-10-22 IMAGING — MR MR MRA HEAD W/O CM
1 series · 23 of 48 positions shown · non-contrast
Comparison: None.

CLINICAL DATA: Pulsatile tinnitus

EXAM:
MRA HEAD WITHOUT CONTRAST
TECHNIQUE: Angiographic images of the Circle of Willis were obtained using MRA
technique without intravenous contrast.

[Series 3: TOF · axial · non-contrast · 0.7mm · 0.37mm/px · z∈[-77,+28]mm · 23 of 160 slices shown]
[im 1/160]
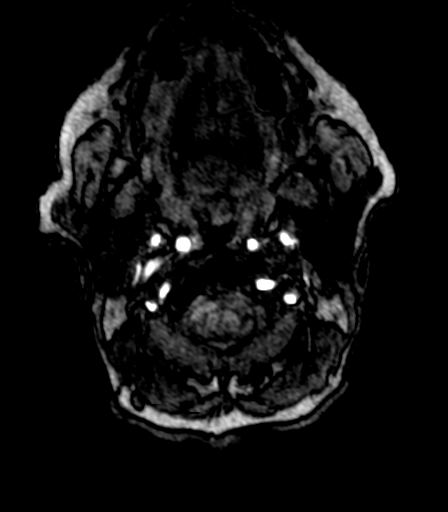
[im 4/160]
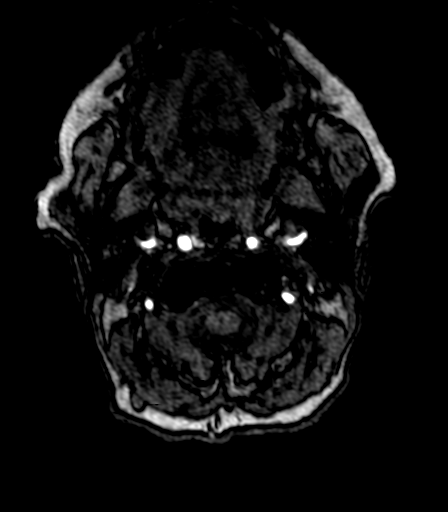
[im 7/160]
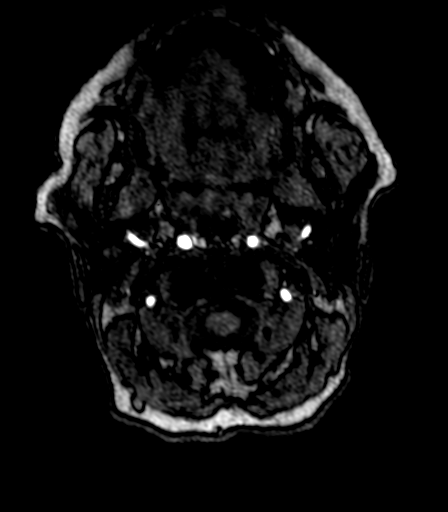
[im 11/160]
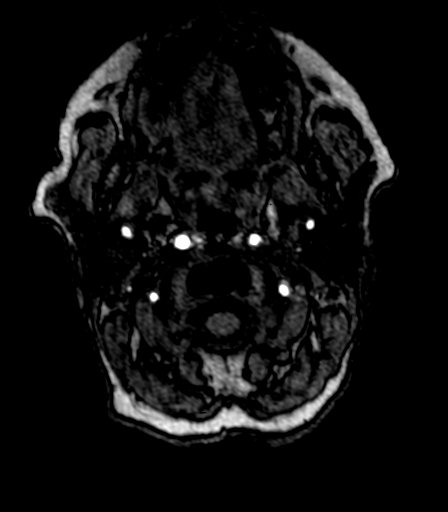
[im 14/160]
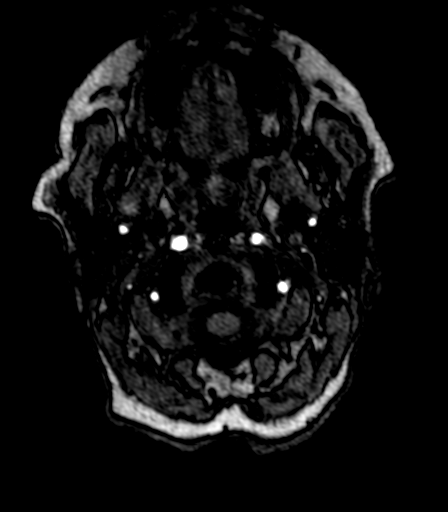
[im 17/160]
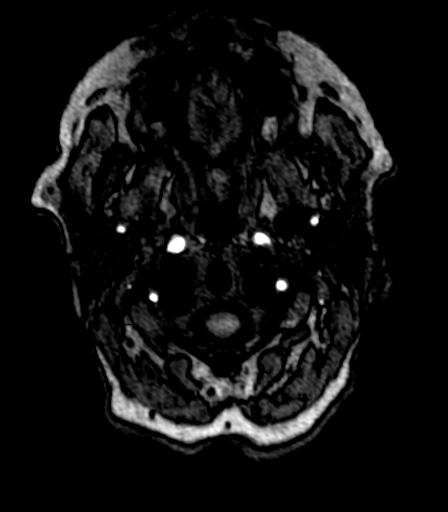
[im 21/160]
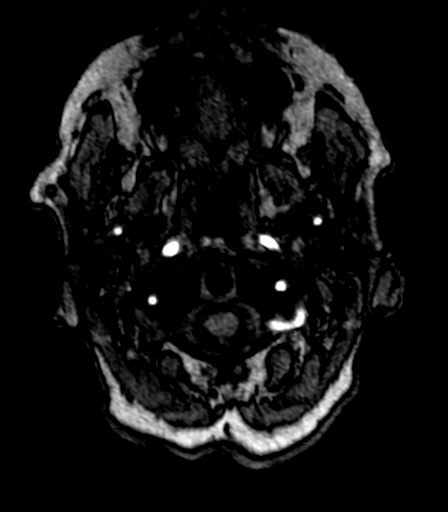
[im 24/160]
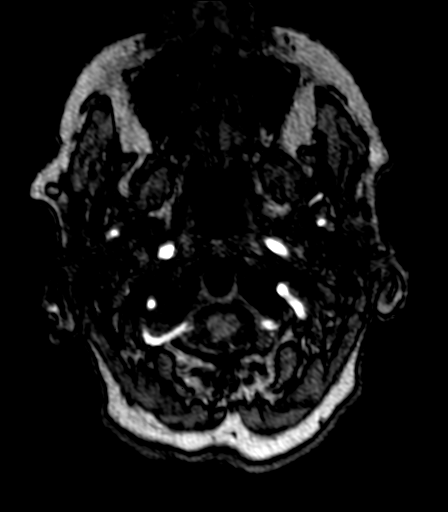
[im 28/160]
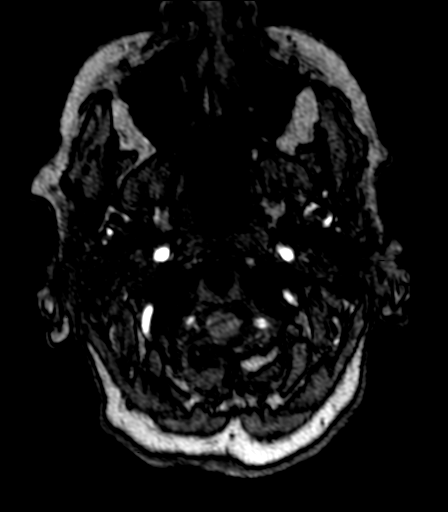
[im 31/160]
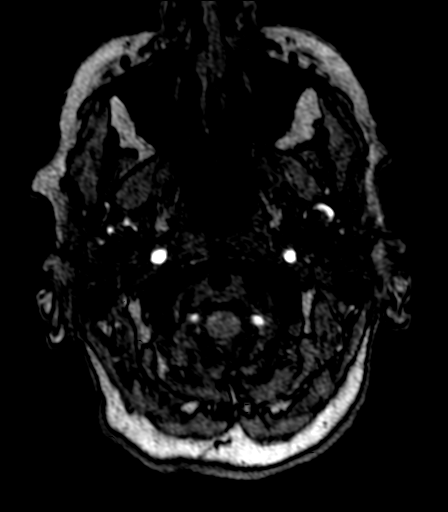
[im 34/160]
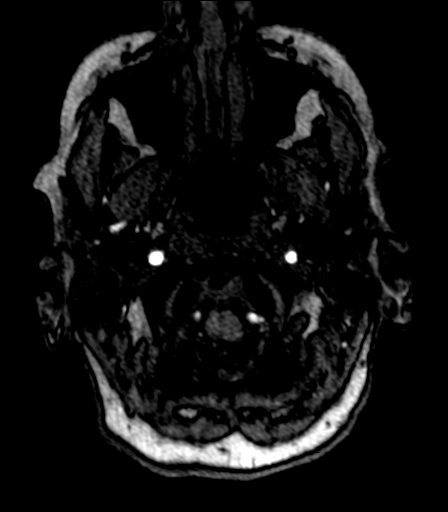
[im 38/160]
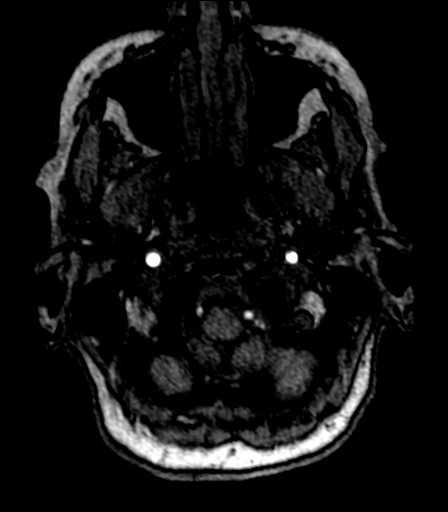
[im 41/160]
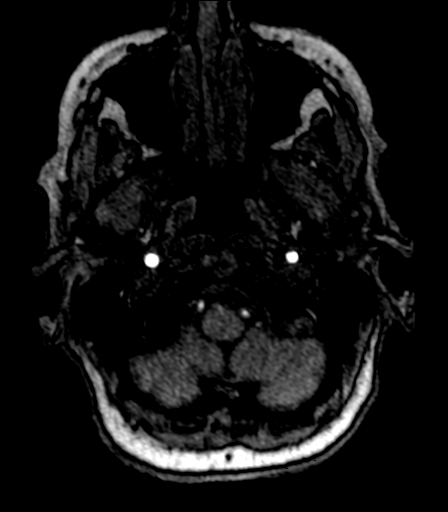
[im 44/160]
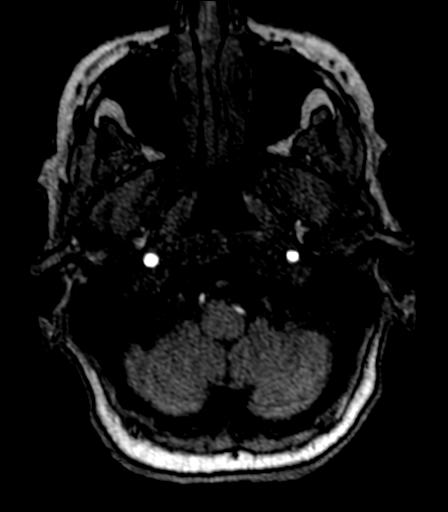
[im 48/160]
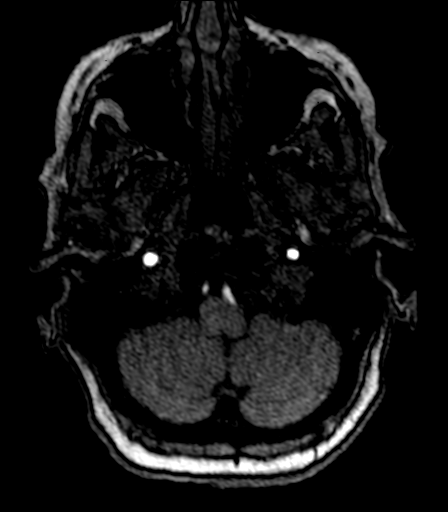
[im 51/160]
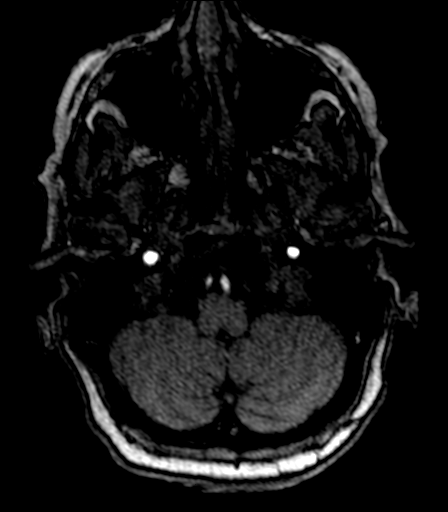
[im 72/160]
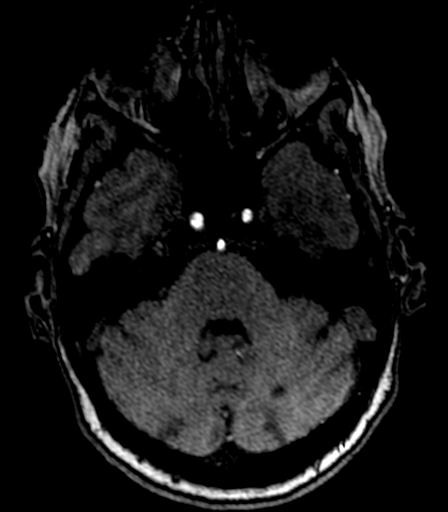
[im 82/160]
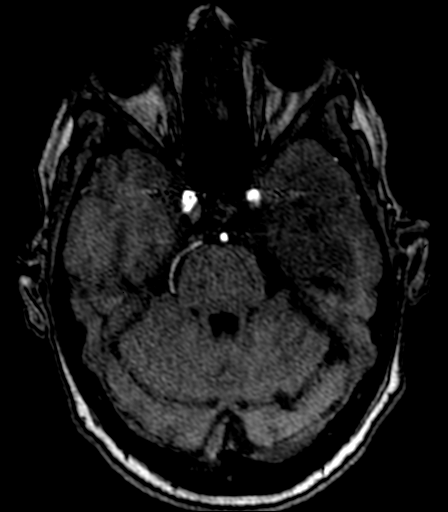
[im 92/160]
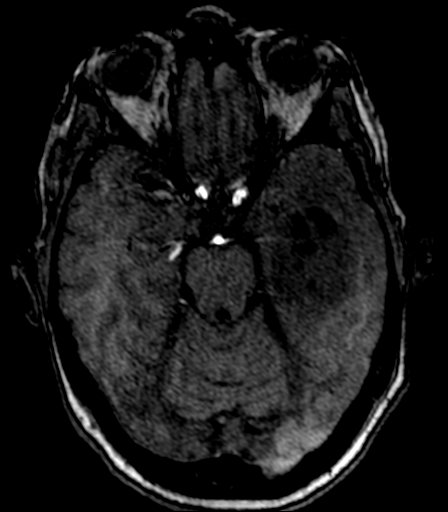
[im 112/160]
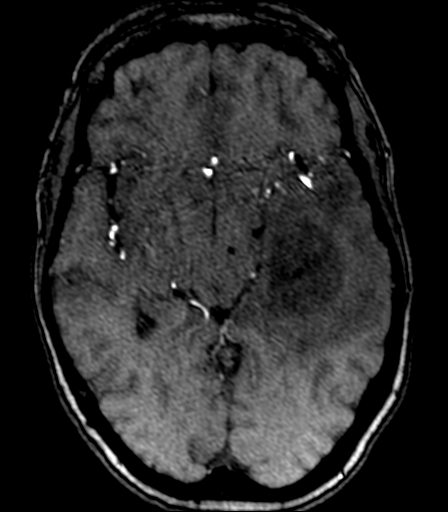
[im 132/160]
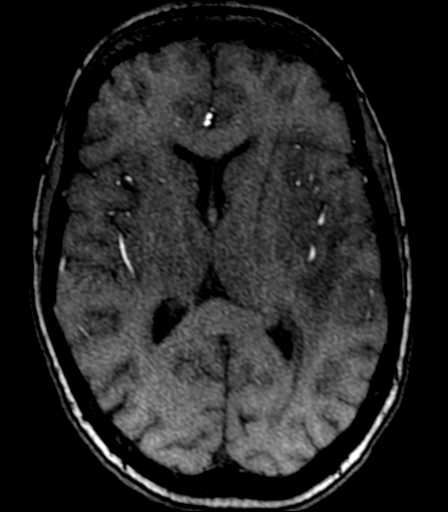
[im 136/160]
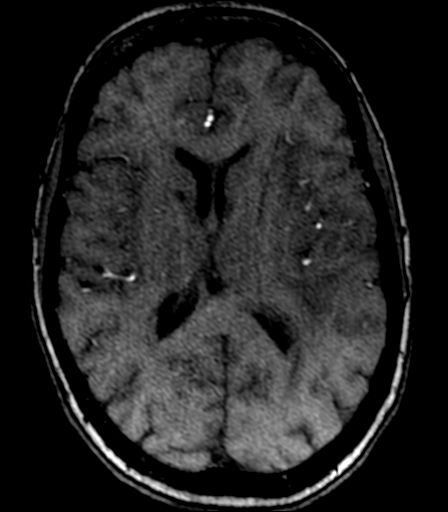
[im 153/160]
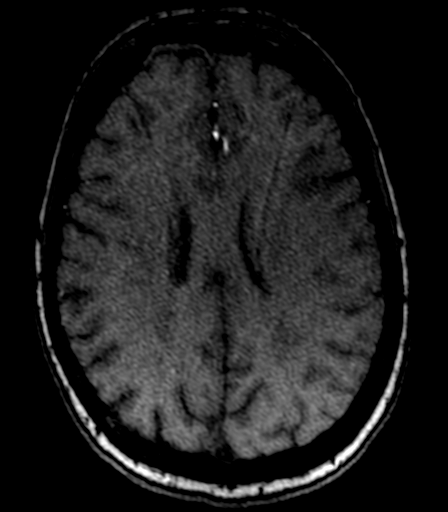

[23 of 48 positions shown; findings below may reference images not displayed]

FINDINGS: Both vertebral arteries patent to the basilar. Left vertebral artery
dominant. Basilar widely patent. Small PICA patent bilaterally. AICA
patent bilaterally. Superior cerebellar and posterior cerebral
arteries patent bilaterally. Fetal origin left posterior cerebral
artery. Mild stenosis of the posterior cerebral artery bilaterally.
Hypoplastic left P1

Atherosclerotic irregularity in the cavernous carotid bilaterally.
Moderate stenosis on the left and mild stenosis on the right.
Hypoplastic left A1 segment. Both anterior cerebral arteries patent.
Anterior and middle cerebral arteries patent bilaterally. Mild
stenosis distal left M1

Negative for cerebral aneurysm.
IMPRESSION: Mild intracranial atherosclerotic disease. Moderate stenosis left
cavernous carotid. Negative for vascular malformation.

## 2018-10-22 MED ORDER — DEXTROSE-NACL 5-0.45 % IV SOLN
INTRAVENOUS | Status: DC
Start: ? — End: 2018-10-22

## 2018-10-22 MED ORDER — SERTRALINE HCL 50 MG PO TABS
25.00 | ORAL_TABLET | ORAL | Status: DC
Start: 2018-11-05 — End: 2018-10-22

## 2018-10-22 MED ORDER — GENTIAN VIOLET 1 % EX SOLN
5.00 | CUTANEOUS | Status: DC
Start: 2018-10-23 — End: 2018-10-22

## 2018-10-22 MED ORDER — ACETAMINOPHEN 325 MG PO TABS
650.00 | ORAL_TABLET | ORAL | Status: DC
Start: ? — End: 2018-10-22

## 2018-10-22 MED ORDER — LIDOCAINE HCL 1 % IJ SOLN
0.50 | INTRAMUSCULAR | Status: DC
Start: ? — End: 2018-10-22

## 2018-10-22 MED ORDER — LEVETIRACETAM 500 MG PO TABS
500.00 | ORAL_TABLET | ORAL | Status: DC
Start: 2018-11-04 — End: 2018-10-22

## 2018-10-22 MED ORDER — GENERIC EXTERNAL MEDICATION
12.50 | Status: DC
Start: 2018-11-05 — End: 2018-10-22

## 2018-10-23 DIAGNOSIS — R1032 Left lower quadrant pain: Secondary | ICD-10-CM | POA: Diagnosis not present

## 2018-10-23 DIAGNOSIS — R0602 Shortness of breath: Secondary | ICD-10-CM | POA: Diagnosis not present

## 2018-10-23 DIAGNOSIS — R748 Abnormal levels of other serum enzymes: Secondary | ICD-10-CM | POA: Diagnosis not present

## 2018-10-23 DIAGNOSIS — I712 Thoracic aortic aneurysm, without rupture: Secondary | ICD-10-CM | POA: Diagnosis not present

## 2018-10-23 DIAGNOSIS — J449 Chronic obstructive pulmonary disease, unspecified: Secondary | ICD-10-CM | POA: Diagnosis not present

## 2018-10-23 DIAGNOSIS — K838 Other specified diseases of biliary tract: Secondary | ICD-10-CM | POA: Diagnosis not present

## 2018-10-23 DIAGNOSIS — Z720 Tobacco use: Secondary | ICD-10-CM | POA: Diagnosis not present

## 2018-10-23 DIAGNOSIS — C712 Malignant neoplasm of temporal lobe: Secondary | ICD-10-CM | POA: Diagnosis not present

## 2018-10-23 DIAGNOSIS — R627 Adult failure to thrive: Secondary | ICD-10-CM | POA: Diagnosis not present

## 2018-10-24 DIAGNOSIS — R109 Unspecified abdominal pain: Secondary | ICD-10-CM | POA: Diagnosis not present

## 2018-10-24 DIAGNOSIS — R630 Anorexia: Secondary | ICD-10-CM | POA: Diagnosis not present

## 2018-10-24 DIAGNOSIS — K5900 Constipation, unspecified: Secondary | ICD-10-CM | POA: Diagnosis not present

## 2018-10-24 DIAGNOSIS — R627 Adult failure to thrive: Secondary | ICD-10-CM | POA: Diagnosis not present

## 2018-10-24 DIAGNOSIS — C712 Malignant neoplasm of temporal lobe: Secondary | ICD-10-CM | POA: Diagnosis not present

## 2018-10-24 DIAGNOSIS — K3189 Other diseases of stomach and duodenum: Secondary | ICD-10-CM | POA: Diagnosis not present

## 2018-10-24 DIAGNOSIS — I712 Thoracic aortic aneurysm, without rupture: Secondary | ICD-10-CM | POA: Diagnosis not present

## 2018-10-24 DIAGNOSIS — R112 Nausea with vomiting, unspecified: Secondary | ICD-10-CM | POA: Diagnosis not present

## 2018-10-24 DIAGNOSIS — K766 Portal hypertension: Secondary | ICD-10-CM | POA: Diagnosis not present

## 2018-10-24 DIAGNOSIS — Z720 Tobacco use: Secondary | ICD-10-CM | POA: Diagnosis not present

## 2018-10-24 DIAGNOSIS — K2950 Unspecified chronic gastritis without bleeding: Secondary | ICD-10-CM | POA: Diagnosis not present

## 2018-10-24 DIAGNOSIS — J449 Chronic obstructive pulmonary disease, unspecified: Secondary | ICD-10-CM | POA: Diagnosis not present

## 2018-10-24 DIAGNOSIS — R1032 Left lower quadrant pain: Secondary | ICD-10-CM | POA: Diagnosis not present

## 2018-10-24 DIAGNOSIS — R0602 Shortness of breath: Secondary | ICD-10-CM | POA: Diagnosis not present

## 2018-10-24 DIAGNOSIS — K5939 Other megacolon: Secondary | ICD-10-CM | POA: Diagnosis not present

## 2018-10-24 DIAGNOSIS — R6881 Early satiety: Secondary | ICD-10-CM | POA: Diagnosis not present

## 2018-10-24 DIAGNOSIS — R748 Abnormal levels of other serum enzymes: Secondary | ICD-10-CM | POA: Diagnosis not present

## 2018-10-25 DIAGNOSIS — K5900 Constipation, unspecified: Secondary | ICD-10-CM | POA: Diagnosis not present

## 2018-10-25 DIAGNOSIS — R1032 Left lower quadrant pain: Secondary | ICD-10-CM | POA: Diagnosis not present

## 2018-10-25 DIAGNOSIS — R627 Adult failure to thrive: Secondary | ICD-10-CM | POA: Diagnosis not present

## 2018-10-25 DIAGNOSIS — K5939 Other megacolon: Secondary | ICD-10-CM | POA: Diagnosis not present

## 2018-10-26 DIAGNOSIS — R627 Adult failure to thrive: Secondary | ICD-10-CM | POA: Diagnosis not present

## 2018-10-27 DIAGNOSIS — R627 Adult failure to thrive: Secondary | ICD-10-CM | POA: Diagnosis not present

## 2018-10-27 DIAGNOSIS — J449 Chronic obstructive pulmonary disease, unspecified: Secondary | ICD-10-CM | POA: Diagnosis not present

## 2018-10-27 DIAGNOSIS — I712 Thoracic aortic aneurysm, without rupture: Secondary | ICD-10-CM | POA: Diagnosis not present

## 2018-10-27 DIAGNOSIS — C712 Malignant neoplasm of temporal lobe: Secondary | ICD-10-CM | POA: Diagnosis not present

## 2018-10-27 DIAGNOSIS — Z720 Tobacco use: Secondary | ICD-10-CM | POA: Diagnosis not present

## 2018-10-27 DIAGNOSIS — R0602 Shortness of breath: Secondary | ICD-10-CM | POA: Diagnosis not present

## 2018-10-27 DIAGNOSIS — R748 Abnormal levels of other serum enzymes: Secondary | ICD-10-CM | POA: Diagnosis not present

## 2018-10-28 DIAGNOSIS — I712 Thoracic aortic aneurysm, without rupture: Secondary | ICD-10-CM | POA: Diagnosis not present

## 2018-10-28 DIAGNOSIS — R748 Abnormal levels of other serum enzymes: Secondary | ICD-10-CM | POA: Diagnosis not present

## 2018-10-28 DIAGNOSIS — R627 Adult failure to thrive: Secondary | ICD-10-CM | POA: Diagnosis not present

## 2018-10-28 DIAGNOSIS — J449 Chronic obstructive pulmonary disease, unspecified: Secondary | ICD-10-CM | POA: Diagnosis not present

## 2018-10-28 DIAGNOSIS — C712 Malignant neoplasm of temporal lobe: Secondary | ICD-10-CM | POA: Diagnosis not present

## 2018-10-28 DIAGNOSIS — Z4659 Encounter for fitting and adjustment of other gastrointestinal appliance and device: Secondary | ICD-10-CM | POA: Diagnosis not present

## 2018-10-28 DIAGNOSIS — Z720 Tobacco use: Secondary | ICD-10-CM | POA: Diagnosis not present

## 2018-10-28 DIAGNOSIS — R0602 Shortness of breath: Secondary | ICD-10-CM | POA: Diagnosis not present

## 2018-10-29 DIAGNOSIS — I712 Thoracic aortic aneurysm, without rupture: Secondary | ICD-10-CM | POA: Diagnosis not present

## 2018-10-29 DIAGNOSIS — C712 Malignant neoplasm of temporal lobe: Secondary | ICD-10-CM | POA: Diagnosis not present

## 2018-10-29 DIAGNOSIS — R0602 Shortness of breath: Secondary | ICD-10-CM | POA: Diagnosis not present

## 2018-10-29 DIAGNOSIS — Z720 Tobacco use: Secondary | ICD-10-CM | POA: Diagnosis not present

## 2018-10-29 DIAGNOSIS — R748 Abnormal levels of other serum enzymes: Secondary | ICD-10-CM | POA: Diagnosis not present

## 2018-10-29 DIAGNOSIS — J449 Chronic obstructive pulmonary disease, unspecified: Secondary | ICD-10-CM | POA: Diagnosis not present

## 2018-10-29 DIAGNOSIS — R627 Adult failure to thrive: Secondary | ICD-10-CM | POA: Diagnosis not present

## 2018-10-30 DIAGNOSIS — J449 Chronic obstructive pulmonary disease, unspecified: Secondary | ICD-10-CM | POA: Diagnosis not present

## 2018-10-30 DIAGNOSIS — Z720 Tobacco use: Secondary | ICD-10-CM | POA: Diagnosis not present

## 2018-10-30 DIAGNOSIS — C712 Malignant neoplasm of temporal lobe: Secondary | ICD-10-CM | POA: Diagnosis not present

## 2018-10-30 DIAGNOSIS — R627 Adult failure to thrive: Secondary | ICD-10-CM | POA: Diagnosis not present

## 2018-10-30 DIAGNOSIS — R748 Abnormal levels of other serum enzymes: Secondary | ICD-10-CM | POA: Diagnosis not present

## 2018-10-30 DIAGNOSIS — I712 Thoracic aortic aneurysm, without rupture: Secondary | ICD-10-CM | POA: Diagnosis not present

## 2018-10-30 DIAGNOSIS — R0602 Shortness of breath: Secondary | ICD-10-CM | POA: Diagnosis not present

## 2018-10-31 DIAGNOSIS — C712 Malignant neoplasm of temporal lobe: Secondary | ICD-10-CM | POA: Diagnosis not present

## 2018-10-31 DIAGNOSIS — R0602 Shortness of breath: Secondary | ICD-10-CM | POA: Diagnosis not present

## 2018-10-31 DIAGNOSIS — I712 Thoracic aortic aneurysm, without rupture: Secondary | ICD-10-CM | POA: Diagnosis not present

## 2018-10-31 DIAGNOSIS — R748 Abnormal levels of other serum enzymes: Secondary | ICD-10-CM | POA: Diagnosis not present

## 2018-10-31 DIAGNOSIS — J449 Chronic obstructive pulmonary disease, unspecified: Secondary | ICD-10-CM | POA: Diagnosis not present

## 2018-10-31 DIAGNOSIS — R627 Adult failure to thrive: Secondary | ICD-10-CM | POA: Diagnosis not present

## 2018-10-31 DIAGNOSIS — E43 Unspecified severe protein-calorie malnutrition: Secondary | ICD-10-CM | POA: Diagnosis not present

## 2018-10-31 DIAGNOSIS — Z720 Tobacco use: Secondary | ICD-10-CM | POA: Diagnosis not present

## 2018-11-01 DIAGNOSIS — J449 Chronic obstructive pulmonary disease, unspecified: Secondary | ICD-10-CM | POA: Diagnosis not present

## 2018-11-01 DIAGNOSIS — C712 Malignant neoplasm of temporal lobe: Secondary | ICD-10-CM | POA: Diagnosis not present

## 2018-11-01 DIAGNOSIS — R0602 Shortness of breath: Secondary | ICD-10-CM | POA: Diagnosis not present

## 2018-11-01 DIAGNOSIS — Z720 Tobacco use: Secondary | ICD-10-CM | POA: Diagnosis not present

## 2018-11-01 DIAGNOSIS — I712 Thoracic aortic aneurysm, without rupture: Secondary | ICD-10-CM | POA: Diagnosis not present

## 2018-11-01 DIAGNOSIS — R748 Abnormal levels of other serum enzymes: Secondary | ICD-10-CM | POA: Diagnosis not present

## 2018-11-01 DIAGNOSIS — R627 Adult failure to thrive: Secondary | ICD-10-CM | POA: Diagnosis not present

## 2018-11-02 DIAGNOSIS — R0602 Shortness of breath: Secondary | ICD-10-CM | POA: Diagnosis not present

## 2018-11-02 DIAGNOSIS — I712 Thoracic aortic aneurysm, without rupture: Secondary | ICD-10-CM | POA: Diagnosis not present

## 2018-11-02 DIAGNOSIS — J449 Chronic obstructive pulmonary disease, unspecified: Secondary | ICD-10-CM | POA: Diagnosis not present

## 2018-11-02 DIAGNOSIS — R748 Abnormal levels of other serum enzymes: Secondary | ICD-10-CM | POA: Diagnosis not present

## 2018-11-02 DIAGNOSIS — R627 Adult failure to thrive: Secondary | ICD-10-CM | POA: Diagnosis not present

## 2018-11-02 DIAGNOSIS — Z720 Tobacco use: Secondary | ICD-10-CM | POA: Diagnosis not present

## 2018-11-02 DIAGNOSIS — C712 Malignant neoplasm of temporal lobe: Secondary | ICD-10-CM | POA: Diagnosis not present

## 2018-11-03 DIAGNOSIS — Z720 Tobacco use: Secondary | ICD-10-CM | POA: Diagnosis not present

## 2018-11-03 DIAGNOSIS — J449 Chronic obstructive pulmonary disease, unspecified: Secondary | ICD-10-CM | POA: Diagnosis not present

## 2018-11-03 DIAGNOSIS — R634 Abnormal weight loss: Secondary | ICD-10-CM | POA: Diagnosis not present

## 2018-11-03 DIAGNOSIS — R748 Abnormal levels of other serum enzymes: Secondary | ICD-10-CM | POA: Diagnosis not present

## 2018-11-03 DIAGNOSIS — I712 Thoracic aortic aneurysm, without rupture: Secondary | ICD-10-CM | POA: Diagnosis not present

## 2018-11-03 DIAGNOSIS — R627 Adult failure to thrive: Secondary | ICD-10-CM | POA: Diagnosis not present

## 2018-11-03 DIAGNOSIS — C712 Malignant neoplasm of temporal lobe: Secondary | ICD-10-CM | POA: Diagnosis not present

## 2018-11-03 DIAGNOSIS — E43 Unspecified severe protein-calorie malnutrition: Secondary | ICD-10-CM | POA: Diagnosis not present

## 2018-11-03 DIAGNOSIS — R0602 Shortness of breath: Secondary | ICD-10-CM | POA: Diagnosis not present

## 2018-11-04 DIAGNOSIS — C712 Malignant neoplasm of temporal lobe: Secondary | ICD-10-CM | POA: Diagnosis not present

## 2018-11-04 DIAGNOSIS — I712 Thoracic aortic aneurysm, without rupture: Secondary | ICD-10-CM | POA: Diagnosis not present

## 2018-11-04 DIAGNOSIS — J449 Chronic obstructive pulmonary disease, unspecified: Secondary | ICD-10-CM | POA: Diagnosis not present

## 2018-11-04 DIAGNOSIS — R627 Adult failure to thrive: Secondary | ICD-10-CM | POA: Diagnosis not present

## 2018-11-04 DIAGNOSIS — R0602 Shortness of breath: Secondary | ICD-10-CM | POA: Diagnosis not present

## 2018-11-04 DIAGNOSIS — E43 Unspecified severe protein-calorie malnutrition: Secondary | ICD-10-CM | POA: Diagnosis not present

## 2018-11-04 DIAGNOSIS — K559 Vascular disorder of intestine, unspecified: Secondary | ICD-10-CM | POA: Diagnosis not present

## 2018-11-04 DIAGNOSIS — R748 Abnormal levels of other serum enzymes: Secondary | ICD-10-CM | POA: Diagnosis not present

## 2018-11-04 DIAGNOSIS — R634 Abnormal weight loss: Secondary | ICD-10-CM | POA: Diagnosis not present

## 2018-11-04 DIAGNOSIS — Z720 Tobacco use: Secondary | ICD-10-CM | POA: Diagnosis not present

## 2018-11-04 MED ORDER — MENTHOL 7.6 MG MT LOZG
1.00 | LOZENGE | OROMUCOSAL | Status: DC
Start: ? — End: 2018-11-04

## 2018-11-04 MED ORDER — ACETAMINOPHEN 325 MG PO TABS
650.00 | ORAL_TABLET | ORAL | Status: DC
Start: ? — End: 2018-11-04

## 2018-11-04 MED ORDER — BENZONATATE 100 MG PO CAPS
100.00 | ORAL_CAPSULE | ORAL | Status: DC
Start: ? — End: 2018-11-04

## 2018-11-04 MED ORDER — SENNOSIDES-DOCUSATE SODIUM 8.6-50 MG PO TABS
2.00 | ORAL_TABLET | ORAL | Status: DC
Start: 2018-11-05 — End: 2018-11-04

## 2018-11-04 MED ORDER — POLYETHYLENE GLYCOL 3350 17 G PO PACK
17.00 | PACK | ORAL | Status: DC
Start: 2018-11-05 — End: 2018-11-04

## 2018-11-04 MED ORDER — TRAMADOL HCL 50 MG PO TABS
50.00 | ORAL_TABLET | ORAL | Status: DC
Start: 2018-11-04 — End: 2018-11-04

## 2018-11-04 MED ORDER — GENERIC EXTERNAL MEDICATION
5.00 | Status: DC
Start: ? — End: 2018-11-04

## 2018-11-04 MED ORDER — BISACODYL 5 MG PO TBEC
10.00 | DELAYED_RELEASE_TABLET | ORAL | Status: DC
Start: ? — End: 2018-11-04

## 2018-11-04 MED ORDER — DEXTROSE 10 % IV SOLN
50.00 | INTRAVENOUS | Status: DC
Start: ? — End: 2018-11-04

## 2018-11-04 MED ORDER — HYDROMORPHONE HCL 2 MG PO TABS
2.00 | ORAL_TABLET | ORAL | Status: DC
Start: ? — End: 2018-11-04

## 2018-11-04 MED ORDER — GUAIFENESIN ER 600 MG PO TB12
600.00 | ORAL_TABLET | ORAL | Status: DC
Start: 2018-11-05 — End: 2018-11-04

## 2018-11-04 MED ORDER — GENERIC EXTERNAL MEDICATION
30.00 | Status: DC
Start: ? — End: 2018-11-04

## 2018-11-04 MED ORDER — SIMETHICONE 80 MG PO CHEW
80.00 | CHEWABLE_TABLET | ORAL | Status: DC
Start: 2018-11-05 — End: 2018-11-04

## 2018-11-05 DIAGNOSIS — E43 Unspecified severe protein-calorie malnutrition: Secondary | ICD-10-CM | POA: Diagnosis not present

## 2018-11-05 DIAGNOSIS — R634 Abnormal weight loss: Secondary | ICD-10-CM | POA: Diagnosis not present

## 2018-11-05 DIAGNOSIS — R627 Adult failure to thrive: Secondary | ICD-10-CM | POA: Diagnosis not present

## 2018-11-06 DIAGNOSIS — R634 Abnormal weight loss: Secondary | ICD-10-CM | POA: Diagnosis not present

## 2018-11-06 DIAGNOSIS — E43 Unspecified severe protein-calorie malnutrition: Secondary | ICD-10-CM | POA: Diagnosis not present

## 2018-11-06 DIAGNOSIS — R627 Adult failure to thrive: Secondary | ICD-10-CM | POA: Diagnosis not present

## 2018-11-07 DIAGNOSIS — R627 Adult failure to thrive: Secondary | ICD-10-CM | POA: Diagnosis not present

## 2018-11-07 DIAGNOSIS — E43 Unspecified severe protein-calorie malnutrition: Secondary | ICD-10-CM | POA: Diagnosis not present

## 2018-11-07 DIAGNOSIS — R634 Abnormal weight loss: Secondary | ICD-10-CM | POA: Diagnosis not present

## 2018-11-08 DIAGNOSIS — E43 Unspecified severe protein-calorie malnutrition: Secondary | ICD-10-CM | POA: Diagnosis not present

## 2018-11-08 DIAGNOSIS — R627 Adult failure to thrive: Secondary | ICD-10-CM | POA: Diagnosis not present

## 2018-11-08 DIAGNOSIS — R634 Abnormal weight loss: Secondary | ICD-10-CM | POA: Diagnosis not present

## 2018-11-09 DIAGNOSIS — R634 Abnormal weight loss: Secondary | ICD-10-CM | POA: Diagnosis not present

## 2018-11-09 DIAGNOSIS — E43 Unspecified severe protein-calorie malnutrition: Secondary | ICD-10-CM | POA: Diagnosis not present

## 2018-11-09 DIAGNOSIS — R627 Adult failure to thrive: Secondary | ICD-10-CM | POA: Diagnosis not present

## 2018-11-10 DIAGNOSIS — E43 Unspecified severe protein-calorie malnutrition: Secondary | ICD-10-CM | POA: Diagnosis not present

## 2018-11-10 DIAGNOSIS — R627 Adult failure to thrive: Secondary | ICD-10-CM | POA: Diagnosis not present

## 2018-11-10 DIAGNOSIS — R634 Abnormal weight loss: Secondary | ICD-10-CM | POA: Diagnosis not present

## 2018-11-11 DIAGNOSIS — E43 Unspecified severe protein-calorie malnutrition: Secondary | ICD-10-CM | POA: Diagnosis not present

## 2018-11-11 DIAGNOSIS — R627 Adult failure to thrive: Secondary | ICD-10-CM | POA: Diagnosis not present

## 2018-11-11 DIAGNOSIS — R634 Abnormal weight loss: Secondary | ICD-10-CM | POA: Diagnosis not present

## 2018-11-12 DIAGNOSIS — R627 Adult failure to thrive: Secondary | ICD-10-CM | POA: Diagnosis not present

## 2018-11-12 DIAGNOSIS — R634 Abnormal weight loss: Secondary | ICD-10-CM | POA: Diagnosis not present

## 2018-11-12 DIAGNOSIS — E43 Unspecified severe protein-calorie malnutrition: Secondary | ICD-10-CM | POA: Diagnosis not present

## 2018-11-13 DIAGNOSIS — E43 Unspecified severe protein-calorie malnutrition: Secondary | ICD-10-CM | POA: Diagnosis not present

## 2018-11-13 DIAGNOSIS — R627 Adult failure to thrive: Secondary | ICD-10-CM | POA: Diagnosis not present

## 2018-11-13 DIAGNOSIS — R634 Abnormal weight loss: Secondary | ICD-10-CM | POA: Diagnosis not present

## 2018-11-14 DIAGNOSIS — R634 Abnormal weight loss: Secondary | ICD-10-CM | POA: Diagnosis not present

## 2018-11-14 DIAGNOSIS — R627 Adult failure to thrive: Secondary | ICD-10-CM | POA: Diagnosis not present

## 2018-11-14 DIAGNOSIS — E43 Unspecified severe protein-calorie malnutrition: Secondary | ICD-10-CM | POA: Diagnosis not present

## 2018-11-15 DIAGNOSIS — R634 Abnormal weight loss: Secondary | ICD-10-CM | POA: Diagnosis not present

## 2018-11-15 DIAGNOSIS — E43 Unspecified severe protein-calorie malnutrition: Secondary | ICD-10-CM | POA: Diagnosis not present

## 2018-11-15 DIAGNOSIS — R627 Adult failure to thrive: Secondary | ICD-10-CM | POA: Diagnosis not present

## 2018-11-16 DIAGNOSIS — R634 Abnormal weight loss: Secondary | ICD-10-CM | POA: Diagnosis not present

## 2018-11-16 DIAGNOSIS — E43 Unspecified severe protein-calorie malnutrition: Secondary | ICD-10-CM | POA: Diagnosis not present

## 2018-11-16 DIAGNOSIS — R627 Adult failure to thrive: Secondary | ICD-10-CM | POA: Diagnosis not present

## 2018-11-17 DIAGNOSIS — R627 Adult failure to thrive: Secondary | ICD-10-CM | POA: Diagnosis not present

## 2018-11-17 DIAGNOSIS — E43 Unspecified severe protein-calorie malnutrition: Secondary | ICD-10-CM | POA: Diagnosis not present

## 2018-11-17 DIAGNOSIS — R634 Abnormal weight loss: Secondary | ICD-10-CM | POA: Diagnosis not present

## 2018-11-18 DIAGNOSIS — R627 Adult failure to thrive: Secondary | ICD-10-CM | POA: Diagnosis not present

## 2018-11-18 DIAGNOSIS — R634 Abnormal weight loss: Secondary | ICD-10-CM | POA: Diagnosis not present

## 2018-11-18 DIAGNOSIS — E43 Unspecified severe protein-calorie malnutrition: Secondary | ICD-10-CM | POA: Diagnosis not present

## 2018-11-19 DIAGNOSIS — E43 Unspecified severe protein-calorie malnutrition: Secondary | ICD-10-CM | POA: Diagnosis not present

## 2018-11-19 DIAGNOSIS — C712 Malignant neoplasm of temporal lobe: Secondary | ICD-10-CM | POA: Diagnosis not present

## 2018-11-19 DIAGNOSIS — R627 Adult failure to thrive: Secondary | ICD-10-CM | POA: Diagnosis not present

## 2018-11-19 DIAGNOSIS — R634 Abnormal weight loss: Secondary | ICD-10-CM | POA: Diagnosis not present

## 2018-11-20 DIAGNOSIS — R634 Abnormal weight loss: Secondary | ICD-10-CM | POA: Diagnosis not present

## 2018-11-20 DIAGNOSIS — E43 Unspecified severe protein-calorie malnutrition: Secondary | ICD-10-CM | POA: Diagnosis not present

## 2018-11-20 DIAGNOSIS — R627 Adult failure to thrive: Secondary | ICD-10-CM | POA: Diagnosis not present

## 2018-11-21 DIAGNOSIS — R634 Abnormal weight loss: Secondary | ICD-10-CM | POA: Diagnosis not present

## 2018-11-21 DIAGNOSIS — E43 Unspecified severe protein-calorie malnutrition: Secondary | ICD-10-CM | POA: Diagnosis not present

## 2018-11-21 DIAGNOSIS — R627 Adult failure to thrive: Secondary | ICD-10-CM | POA: Diagnosis not present

## 2018-11-22 DIAGNOSIS — R634 Abnormal weight loss: Secondary | ICD-10-CM | POA: Diagnosis not present

## 2018-11-22 DIAGNOSIS — R627 Adult failure to thrive: Secondary | ICD-10-CM | POA: Diagnosis not present

## 2018-11-22 DIAGNOSIS — E43 Unspecified severe protein-calorie malnutrition: Secondary | ICD-10-CM | POA: Diagnosis not present

## 2018-11-23 DIAGNOSIS — E43 Unspecified severe protein-calorie malnutrition: Secondary | ICD-10-CM | POA: Diagnosis not present

## 2018-11-23 DIAGNOSIS — R634 Abnormal weight loss: Secondary | ICD-10-CM | POA: Diagnosis not present

## 2018-11-23 DIAGNOSIS — R627 Adult failure to thrive: Secondary | ICD-10-CM | POA: Diagnosis not present

## 2018-11-24 DIAGNOSIS — R627 Adult failure to thrive: Secondary | ICD-10-CM | POA: Diagnosis not present

## 2018-11-24 DIAGNOSIS — E43 Unspecified severe protein-calorie malnutrition: Secondary | ICD-10-CM | POA: Diagnosis not present

## 2018-11-24 DIAGNOSIS — R634 Abnormal weight loss: Secondary | ICD-10-CM | POA: Diagnosis not present

## 2018-11-25 DIAGNOSIS — R627 Adult failure to thrive: Secondary | ICD-10-CM | POA: Diagnosis not present

## 2018-11-25 DIAGNOSIS — E43 Unspecified severe protein-calorie malnutrition: Secondary | ICD-10-CM | POA: Diagnosis not present

## 2018-11-25 DIAGNOSIS — R634 Abnormal weight loss: Secondary | ICD-10-CM | POA: Diagnosis not present

## 2018-11-26 DIAGNOSIS — R627 Adult failure to thrive: Secondary | ICD-10-CM | POA: Diagnosis not present

## 2018-11-26 DIAGNOSIS — E43 Unspecified severe protein-calorie malnutrition: Secondary | ICD-10-CM | POA: Diagnosis not present

## 2018-11-26 DIAGNOSIS — R634 Abnormal weight loss: Secondary | ICD-10-CM | POA: Diagnosis not present

## 2018-11-27 DIAGNOSIS — E43 Unspecified severe protein-calorie malnutrition: Secondary | ICD-10-CM | POA: Diagnosis not present

## 2018-11-27 DIAGNOSIS — R634 Abnormal weight loss: Secondary | ICD-10-CM | POA: Diagnosis not present

## 2018-11-27 DIAGNOSIS — R627 Adult failure to thrive: Secondary | ICD-10-CM | POA: Diagnosis not present

## 2018-11-28 DIAGNOSIS — E43 Unspecified severe protein-calorie malnutrition: Secondary | ICD-10-CM | POA: Diagnosis not present

## 2018-11-28 DIAGNOSIS — R634 Abnormal weight loss: Secondary | ICD-10-CM | POA: Diagnosis not present

## 2018-11-28 DIAGNOSIS — R627 Adult failure to thrive: Secondary | ICD-10-CM | POA: Diagnosis not present

## 2018-11-29 DIAGNOSIS — E43 Unspecified severe protein-calorie malnutrition: Secondary | ICD-10-CM | POA: Diagnosis not present

## 2018-11-29 DIAGNOSIS — R634 Abnormal weight loss: Secondary | ICD-10-CM | POA: Diagnosis not present

## 2018-11-29 DIAGNOSIS — R627 Adult failure to thrive: Secondary | ICD-10-CM | POA: Diagnosis not present

## 2018-11-30 DIAGNOSIS — E43 Unspecified severe protein-calorie malnutrition: Secondary | ICD-10-CM | POA: Diagnosis not present

## 2018-11-30 DIAGNOSIS — R627 Adult failure to thrive: Secondary | ICD-10-CM | POA: Diagnosis not present

## 2018-11-30 DIAGNOSIS — R634 Abnormal weight loss: Secondary | ICD-10-CM | POA: Diagnosis not present

## 2018-12-01 DIAGNOSIS — R634 Abnormal weight loss: Secondary | ICD-10-CM | POA: Diagnosis not present

## 2018-12-01 DIAGNOSIS — R627 Adult failure to thrive: Secondary | ICD-10-CM | POA: Diagnosis not present

## 2018-12-01 DIAGNOSIS — E43 Unspecified severe protein-calorie malnutrition: Secondary | ICD-10-CM | POA: Diagnosis not present

## 2018-12-02 DIAGNOSIS — R627 Adult failure to thrive: Secondary | ICD-10-CM | POA: Diagnosis not present

## 2018-12-02 DIAGNOSIS — R634 Abnormal weight loss: Secondary | ICD-10-CM | POA: Diagnosis not present

## 2018-12-02 DIAGNOSIS — E43 Unspecified severe protein-calorie malnutrition: Secondary | ICD-10-CM | POA: Diagnosis not present

## 2018-12-03 DIAGNOSIS — R634 Abnormal weight loss: Secondary | ICD-10-CM | POA: Diagnosis not present

## 2018-12-03 DIAGNOSIS — R627 Adult failure to thrive: Secondary | ICD-10-CM | POA: Diagnosis not present

## 2018-12-03 DIAGNOSIS — E43 Unspecified severe protein-calorie malnutrition: Secondary | ICD-10-CM | POA: Diagnosis not present

## 2018-12-04 DIAGNOSIS — R634 Abnormal weight loss: Secondary | ICD-10-CM | POA: Diagnosis not present

## 2018-12-04 DIAGNOSIS — R627 Adult failure to thrive: Secondary | ICD-10-CM | POA: Diagnosis not present

## 2018-12-04 DIAGNOSIS — E43 Unspecified severe protein-calorie malnutrition: Secondary | ICD-10-CM | POA: Diagnosis not present

## 2018-12-05 DIAGNOSIS — R634 Abnormal weight loss: Secondary | ICD-10-CM | POA: Diagnosis not present

## 2018-12-05 DIAGNOSIS — R627 Adult failure to thrive: Secondary | ICD-10-CM | POA: Diagnosis not present

## 2018-12-05 DIAGNOSIS — E43 Unspecified severe protein-calorie malnutrition: Secondary | ICD-10-CM | POA: Diagnosis not present

## 2018-12-06 DIAGNOSIS — E43 Unspecified severe protein-calorie malnutrition: Secondary | ICD-10-CM | POA: Diagnosis not present

## 2018-12-06 DIAGNOSIS — R627 Adult failure to thrive: Secondary | ICD-10-CM | POA: Diagnosis not present

## 2018-12-06 DIAGNOSIS — R634 Abnormal weight loss: Secondary | ICD-10-CM | POA: Diagnosis not present

## 2018-12-07 DIAGNOSIS — E43 Unspecified severe protein-calorie malnutrition: Secondary | ICD-10-CM | POA: Diagnosis not present

## 2018-12-07 DIAGNOSIS — R634 Abnormal weight loss: Secondary | ICD-10-CM | POA: Diagnosis not present

## 2018-12-07 DIAGNOSIS — R627 Adult failure to thrive: Secondary | ICD-10-CM | POA: Diagnosis not present

## 2018-12-08 DIAGNOSIS — E43 Unspecified severe protein-calorie malnutrition: Secondary | ICD-10-CM | POA: Diagnosis not present

## 2018-12-08 DIAGNOSIS — R627 Adult failure to thrive: Secondary | ICD-10-CM | POA: Diagnosis not present

## 2018-12-08 DIAGNOSIS — R634 Abnormal weight loss: Secondary | ICD-10-CM | POA: Diagnosis not present

## 2018-12-09 DIAGNOSIS — E43 Unspecified severe protein-calorie malnutrition: Secondary | ICD-10-CM | POA: Diagnosis not present

## 2018-12-09 DIAGNOSIS — R634 Abnormal weight loss: Secondary | ICD-10-CM | POA: Diagnosis not present

## 2018-12-09 DIAGNOSIS — R627 Adult failure to thrive: Secondary | ICD-10-CM | POA: Diagnosis not present

## 2018-12-10 DIAGNOSIS — R627 Adult failure to thrive: Secondary | ICD-10-CM | POA: Diagnosis not present

## 2018-12-10 DIAGNOSIS — E43 Unspecified severe protein-calorie malnutrition: Secondary | ICD-10-CM | POA: Diagnosis not present

## 2018-12-10 DIAGNOSIS — R634 Abnormal weight loss: Secondary | ICD-10-CM | POA: Diagnosis not present

## 2018-12-11 DIAGNOSIS — E43 Unspecified severe protein-calorie malnutrition: Secondary | ICD-10-CM | POA: Diagnosis not present

## 2018-12-11 DIAGNOSIS — R627 Adult failure to thrive: Secondary | ICD-10-CM | POA: Diagnosis not present

## 2018-12-11 DIAGNOSIS — R634 Abnormal weight loss: Secondary | ICD-10-CM | POA: Diagnosis not present

## 2018-12-12 DIAGNOSIS — R634 Abnormal weight loss: Secondary | ICD-10-CM | POA: Diagnosis not present

## 2018-12-12 DIAGNOSIS — E43 Unspecified severe protein-calorie malnutrition: Secondary | ICD-10-CM | POA: Diagnosis not present

## 2018-12-12 DIAGNOSIS — R627 Adult failure to thrive: Secondary | ICD-10-CM | POA: Diagnosis not present

## 2018-12-13 DIAGNOSIS — E43 Unspecified severe protein-calorie malnutrition: Secondary | ICD-10-CM | POA: Diagnosis not present

## 2018-12-13 DIAGNOSIS — R634 Abnormal weight loss: Secondary | ICD-10-CM | POA: Diagnosis not present

## 2018-12-13 DIAGNOSIS — R627 Adult failure to thrive: Secondary | ICD-10-CM | POA: Diagnosis not present

## 2018-12-14 DIAGNOSIS — E43 Unspecified severe protein-calorie malnutrition: Secondary | ICD-10-CM | POA: Diagnosis not present

## 2018-12-14 DIAGNOSIS — R627 Adult failure to thrive: Secondary | ICD-10-CM | POA: Diagnosis not present

## 2018-12-14 DIAGNOSIS — R634 Abnormal weight loss: Secondary | ICD-10-CM | POA: Diagnosis not present

## 2018-12-15 DIAGNOSIS — R627 Adult failure to thrive: Secondary | ICD-10-CM | POA: Diagnosis not present

## 2018-12-15 DIAGNOSIS — R634 Abnormal weight loss: Secondary | ICD-10-CM | POA: Diagnosis not present

## 2018-12-15 DIAGNOSIS — E43 Unspecified severe protein-calorie malnutrition: Secondary | ICD-10-CM | POA: Diagnosis not present

## 2018-12-16 DIAGNOSIS — R627 Adult failure to thrive: Secondary | ICD-10-CM | POA: Diagnosis not present

## 2018-12-16 DIAGNOSIS — R634 Abnormal weight loss: Secondary | ICD-10-CM | POA: Diagnosis not present

## 2018-12-16 DIAGNOSIS — E43 Unspecified severe protein-calorie malnutrition: Secondary | ICD-10-CM | POA: Diagnosis not present

## 2018-12-17 DIAGNOSIS — R634 Abnormal weight loss: Secondary | ICD-10-CM | POA: Diagnosis not present

## 2018-12-17 DIAGNOSIS — C712 Malignant neoplasm of temporal lobe: Secondary | ICD-10-CM | POA: Diagnosis not present

## 2018-12-17 DIAGNOSIS — R627 Adult failure to thrive: Secondary | ICD-10-CM | POA: Diagnosis not present

## 2018-12-17 DIAGNOSIS — E43 Unspecified severe protein-calorie malnutrition: Secondary | ICD-10-CM | POA: Diagnosis not present

## 2018-12-18 DIAGNOSIS — R634 Abnormal weight loss: Secondary | ICD-10-CM | POA: Diagnosis not present

## 2018-12-18 DIAGNOSIS — E43 Unspecified severe protein-calorie malnutrition: Secondary | ICD-10-CM | POA: Diagnosis not present

## 2018-12-18 DIAGNOSIS — R627 Adult failure to thrive: Secondary | ICD-10-CM | POA: Diagnosis not present

## 2018-12-19 DIAGNOSIS — R627 Adult failure to thrive: Secondary | ICD-10-CM | POA: Diagnosis not present

## 2018-12-19 DIAGNOSIS — E43 Unspecified severe protein-calorie malnutrition: Secondary | ICD-10-CM | POA: Diagnosis not present

## 2018-12-19 DIAGNOSIS — R634 Abnormal weight loss: Secondary | ICD-10-CM | POA: Diagnosis not present

## 2018-12-20 DIAGNOSIS — R634 Abnormal weight loss: Secondary | ICD-10-CM | POA: Diagnosis not present

## 2018-12-20 DIAGNOSIS — R627 Adult failure to thrive: Secondary | ICD-10-CM | POA: Diagnosis not present

## 2018-12-20 DIAGNOSIS — E43 Unspecified severe protein-calorie malnutrition: Secondary | ICD-10-CM | POA: Diagnosis not present

## 2018-12-21 DIAGNOSIS — R634 Abnormal weight loss: Secondary | ICD-10-CM | POA: Diagnosis not present

## 2018-12-21 DIAGNOSIS — R627 Adult failure to thrive: Secondary | ICD-10-CM | POA: Diagnosis not present

## 2018-12-21 DIAGNOSIS — E43 Unspecified severe protein-calorie malnutrition: Secondary | ICD-10-CM | POA: Diagnosis not present

## 2018-12-22 DIAGNOSIS — R627 Adult failure to thrive: Secondary | ICD-10-CM | POA: Diagnosis not present

## 2018-12-22 DIAGNOSIS — R634 Abnormal weight loss: Secondary | ICD-10-CM | POA: Diagnosis not present

## 2018-12-22 DIAGNOSIS — E43 Unspecified severe protein-calorie malnutrition: Secondary | ICD-10-CM | POA: Diagnosis not present

## 2018-12-23 DIAGNOSIS — R634 Abnormal weight loss: Secondary | ICD-10-CM | POA: Diagnosis not present

## 2018-12-23 DIAGNOSIS — R627 Adult failure to thrive: Secondary | ICD-10-CM | POA: Diagnosis not present

## 2018-12-23 DIAGNOSIS — E43 Unspecified severe protein-calorie malnutrition: Secondary | ICD-10-CM | POA: Diagnosis not present

## 2018-12-24 DIAGNOSIS — E43 Unspecified severe protein-calorie malnutrition: Secondary | ICD-10-CM | POA: Diagnosis not present

## 2018-12-24 DIAGNOSIS — R627 Adult failure to thrive: Secondary | ICD-10-CM | POA: Diagnosis not present

## 2018-12-24 DIAGNOSIS — R634 Abnormal weight loss: Secondary | ICD-10-CM | POA: Diagnosis not present

## 2018-12-25 DIAGNOSIS — E43 Unspecified severe protein-calorie malnutrition: Secondary | ICD-10-CM | POA: Diagnosis not present

## 2018-12-25 DIAGNOSIS — R634 Abnormal weight loss: Secondary | ICD-10-CM | POA: Diagnosis not present

## 2018-12-25 DIAGNOSIS — R627 Adult failure to thrive: Secondary | ICD-10-CM | POA: Diagnosis not present

## 2018-12-26 DIAGNOSIS — R634 Abnormal weight loss: Secondary | ICD-10-CM | POA: Diagnosis not present

## 2018-12-26 DIAGNOSIS — E43 Unspecified severe protein-calorie malnutrition: Secondary | ICD-10-CM | POA: Diagnosis not present

## 2018-12-26 DIAGNOSIS — R627 Adult failure to thrive: Secondary | ICD-10-CM | POA: Diagnosis not present

## 2018-12-27 DIAGNOSIS — R627 Adult failure to thrive: Secondary | ICD-10-CM | POA: Diagnosis not present

## 2018-12-27 DIAGNOSIS — E43 Unspecified severe protein-calorie malnutrition: Secondary | ICD-10-CM | POA: Diagnosis not present

## 2018-12-27 DIAGNOSIS — R634 Abnormal weight loss: Secondary | ICD-10-CM | POA: Diagnosis not present

## 2018-12-28 DIAGNOSIS — R627 Adult failure to thrive: Secondary | ICD-10-CM | POA: Diagnosis not present

## 2018-12-28 DIAGNOSIS — R634 Abnormal weight loss: Secondary | ICD-10-CM | POA: Diagnosis not present

## 2018-12-28 DIAGNOSIS — E43 Unspecified severe protein-calorie malnutrition: Secondary | ICD-10-CM | POA: Diagnosis not present

## 2018-12-29 DIAGNOSIS — E43 Unspecified severe protein-calorie malnutrition: Secondary | ICD-10-CM | POA: Diagnosis not present

## 2018-12-29 DIAGNOSIS — R634 Abnormal weight loss: Secondary | ICD-10-CM | POA: Diagnosis not present

## 2018-12-29 DIAGNOSIS — R627 Adult failure to thrive: Secondary | ICD-10-CM | POA: Diagnosis not present

## 2018-12-30 DIAGNOSIS — E43 Unspecified severe protein-calorie malnutrition: Secondary | ICD-10-CM | POA: Diagnosis not present

## 2018-12-30 DIAGNOSIS — R634 Abnormal weight loss: Secondary | ICD-10-CM | POA: Diagnosis not present

## 2018-12-30 DIAGNOSIS — R627 Adult failure to thrive: Secondary | ICD-10-CM | POA: Diagnosis not present

## 2018-12-31 DIAGNOSIS — E43 Unspecified severe protein-calorie malnutrition: Secondary | ICD-10-CM | POA: Diagnosis not present

## 2018-12-31 DIAGNOSIS — R634 Abnormal weight loss: Secondary | ICD-10-CM | POA: Diagnosis not present

## 2018-12-31 DIAGNOSIS — R627 Adult failure to thrive: Secondary | ICD-10-CM | POA: Diagnosis not present

## 2019-01-01 DIAGNOSIS — R627 Adult failure to thrive: Secondary | ICD-10-CM | POA: Diagnosis not present

## 2019-01-01 DIAGNOSIS — E43 Unspecified severe protein-calorie malnutrition: Secondary | ICD-10-CM | POA: Diagnosis not present

## 2019-01-01 DIAGNOSIS — R634 Abnormal weight loss: Secondary | ICD-10-CM | POA: Diagnosis not present

## 2019-01-02 DIAGNOSIS — E43 Unspecified severe protein-calorie malnutrition: Secondary | ICD-10-CM | POA: Diagnosis not present

## 2019-01-02 DIAGNOSIS — R627 Adult failure to thrive: Secondary | ICD-10-CM | POA: Diagnosis not present

## 2019-01-02 DIAGNOSIS — R634 Abnormal weight loss: Secondary | ICD-10-CM | POA: Diagnosis not present

## 2019-01-03 DIAGNOSIS — E43 Unspecified severe protein-calorie malnutrition: Secondary | ICD-10-CM | POA: Diagnosis not present

## 2019-01-03 DIAGNOSIS — R627 Adult failure to thrive: Secondary | ICD-10-CM | POA: Diagnosis not present

## 2019-01-03 DIAGNOSIS — R634 Abnormal weight loss: Secondary | ICD-10-CM | POA: Diagnosis not present

## 2019-01-04 DIAGNOSIS — R634 Abnormal weight loss: Secondary | ICD-10-CM | POA: Diagnosis not present

## 2019-01-04 DIAGNOSIS — E43 Unspecified severe protein-calorie malnutrition: Secondary | ICD-10-CM | POA: Diagnosis not present

## 2019-01-04 DIAGNOSIS — R627 Adult failure to thrive: Secondary | ICD-10-CM | POA: Diagnosis not present

## 2019-01-05 DIAGNOSIS — R627 Adult failure to thrive: Secondary | ICD-10-CM | POA: Diagnosis not present

## 2019-01-05 DIAGNOSIS — E43 Unspecified severe protein-calorie malnutrition: Secondary | ICD-10-CM | POA: Diagnosis not present

## 2019-01-05 DIAGNOSIS — R634 Abnormal weight loss: Secondary | ICD-10-CM | POA: Diagnosis not present

## 2019-01-06 DIAGNOSIS — R634 Abnormal weight loss: Secondary | ICD-10-CM | POA: Diagnosis not present

## 2019-01-06 DIAGNOSIS — E43 Unspecified severe protein-calorie malnutrition: Secondary | ICD-10-CM | POA: Diagnosis not present

## 2019-01-06 DIAGNOSIS — R627 Adult failure to thrive: Secondary | ICD-10-CM | POA: Diagnosis not present

## 2019-01-07 DIAGNOSIS — R634 Abnormal weight loss: Secondary | ICD-10-CM | POA: Diagnosis not present

## 2019-01-07 DIAGNOSIS — E43 Unspecified severe protein-calorie malnutrition: Secondary | ICD-10-CM | POA: Diagnosis not present

## 2019-01-07 DIAGNOSIS — R627 Adult failure to thrive: Secondary | ICD-10-CM | POA: Diagnosis not present

## 2019-01-08 DIAGNOSIS — R627 Adult failure to thrive: Secondary | ICD-10-CM | POA: Diagnosis not present

## 2019-01-08 DIAGNOSIS — E43 Unspecified severe protein-calorie malnutrition: Secondary | ICD-10-CM | POA: Diagnosis not present

## 2019-01-08 DIAGNOSIS — R634 Abnormal weight loss: Secondary | ICD-10-CM | POA: Diagnosis not present

## 2019-01-09 DIAGNOSIS — R627 Adult failure to thrive: Secondary | ICD-10-CM | POA: Diagnosis not present

## 2019-01-09 DIAGNOSIS — E43 Unspecified severe protein-calorie malnutrition: Secondary | ICD-10-CM | POA: Diagnosis not present

## 2019-01-09 DIAGNOSIS — R634 Abnormal weight loss: Secondary | ICD-10-CM | POA: Diagnosis not present

## 2019-01-10 DIAGNOSIS — R627 Adult failure to thrive: Secondary | ICD-10-CM | POA: Diagnosis not present

## 2019-01-10 DIAGNOSIS — E43 Unspecified severe protein-calorie malnutrition: Secondary | ICD-10-CM | POA: Diagnosis not present

## 2019-01-10 DIAGNOSIS — R634 Abnormal weight loss: Secondary | ICD-10-CM | POA: Diagnosis not present

## 2019-01-11 DIAGNOSIS — R634 Abnormal weight loss: Secondary | ICD-10-CM | POA: Diagnosis not present

## 2019-01-11 DIAGNOSIS — R627 Adult failure to thrive: Secondary | ICD-10-CM | POA: Diagnosis not present

## 2019-01-11 DIAGNOSIS — E43 Unspecified severe protein-calorie malnutrition: Secondary | ICD-10-CM | POA: Diagnosis not present

## 2019-01-12 DIAGNOSIS — E43 Unspecified severe protein-calorie malnutrition: Secondary | ICD-10-CM | POA: Diagnosis not present

## 2019-01-12 DIAGNOSIS — R627 Adult failure to thrive: Secondary | ICD-10-CM | POA: Diagnosis not present

## 2019-01-12 DIAGNOSIS — R634 Abnormal weight loss: Secondary | ICD-10-CM | POA: Diagnosis not present

## 2019-01-13 DIAGNOSIS — R627 Adult failure to thrive: Secondary | ICD-10-CM | POA: Diagnosis not present

## 2019-01-13 DIAGNOSIS — E43 Unspecified severe protein-calorie malnutrition: Secondary | ICD-10-CM | POA: Diagnosis not present

## 2019-01-13 DIAGNOSIS — R634 Abnormal weight loss: Secondary | ICD-10-CM | POA: Diagnosis not present

## 2019-01-14 DIAGNOSIS — E43 Unspecified severe protein-calorie malnutrition: Secondary | ICD-10-CM | POA: Diagnosis not present

## 2019-01-14 DIAGNOSIS — R627 Adult failure to thrive: Secondary | ICD-10-CM | POA: Diagnosis not present

## 2019-01-14 DIAGNOSIS — R634 Abnormal weight loss: Secondary | ICD-10-CM | POA: Diagnosis not present

## 2019-01-15 DIAGNOSIS — R627 Adult failure to thrive: Secondary | ICD-10-CM | POA: Diagnosis not present

## 2019-01-15 DIAGNOSIS — E43 Unspecified severe protein-calorie malnutrition: Secondary | ICD-10-CM | POA: Diagnosis not present

## 2019-01-15 DIAGNOSIS — R634 Abnormal weight loss: Secondary | ICD-10-CM | POA: Diagnosis not present

## 2019-01-16 DIAGNOSIS — R634 Abnormal weight loss: Secondary | ICD-10-CM | POA: Diagnosis not present

## 2019-01-16 DIAGNOSIS — R627 Adult failure to thrive: Secondary | ICD-10-CM | POA: Diagnosis not present

## 2019-01-16 DIAGNOSIS — E43 Unspecified severe protein-calorie malnutrition: Secondary | ICD-10-CM | POA: Diagnosis not present

## 2019-01-17 DIAGNOSIS — R634 Abnormal weight loss: Secondary | ICD-10-CM | POA: Diagnosis not present

## 2019-01-17 DIAGNOSIS — R627 Adult failure to thrive: Secondary | ICD-10-CM | POA: Diagnosis not present

## 2019-01-17 DIAGNOSIS — E43 Unspecified severe protein-calorie malnutrition: Secondary | ICD-10-CM | POA: Diagnosis not present

## 2019-01-18 DIAGNOSIS — R634 Abnormal weight loss: Secondary | ICD-10-CM | POA: Diagnosis not present

## 2019-01-18 DIAGNOSIS — E43 Unspecified severe protein-calorie malnutrition: Secondary | ICD-10-CM | POA: Diagnosis not present

## 2019-01-18 DIAGNOSIS — R627 Adult failure to thrive: Secondary | ICD-10-CM | POA: Diagnosis not present

## 2019-01-19 DIAGNOSIS — R627 Adult failure to thrive: Secondary | ICD-10-CM | POA: Diagnosis not present

## 2019-01-19 DIAGNOSIS — E43 Unspecified severe protein-calorie malnutrition: Secondary | ICD-10-CM | POA: Diagnosis not present

## 2019-01-19 DIAGNOSIS — R634 Abnormal weight loss: Secondary | ICD-10-CM | POA: Diagnosis not present

## 2019-01-20 DIAGNOSIS — E43 Unspecified severe protein-calorie malnutrition: Secondary | ICD-10-CM | POA: Diagnosis not present

## 2019-01-20 DIAGNOSIS — R627 Adult failure to thrive: Secondary | ICD-10-CM | POA: Diagnosis not present

## 2019-01-20 DIAGNOSIS — R634 Abnormal weight loss: Secondary | ICD-10-CM | POA: Diagnosis not present

## 2019-01-21 DIAGNOSIS — R634 Abnormal weight loss: Secondary | ICD-10-CM | POA: Diagnosis not present

## 2019-01-21 DIAGNOSIS — E43 Unspecified severe protein-calorie malnutrition: Secondary | ICD-10-CM | POA: Diagnosis not present

## 2019-01-21 DIAGNOSIS — R627 Adult failure to thrive: Secondary | ICD-10-CM | POA: Diagnosis not present

## 2019-01-22 DIAGNOSIS — R634 Abnormal weight loss: Secondary | ICD-10-CM | POA: Diagnosis not present

## 2019-01-22 DIAGNOSIS — R627 Adult failure to thrive: Secondary | ICD-10-CM | POA: Diagnosis not present

## 2019-01-22 DIAGNOSIS — E43 Unspecified severe protein-calorie malnutrition: Secondary | ICD-10-CM | POA: Diagnosis not present

## 2019-01-23 DIAGNOSIS — R627 Adult failure to thrive: Secondary | ICD-10-CM | POA: Diagnosis not present

## 2019-01-23 DIAGNOSIS — E43 Unspecified severe protein-calorie malnutrition: Secondary | ICD-10-CM | POA: Diagnosis not present

## 2019-01-23 DIAGNOSIS — R634 Abnormal weight loss: Secondary | ICD-10-CM | POA: Diagnosis not present

## 2019-01-24 DIAGNOSIS — R627 Adult failure to thrive: Secondary | ICD-10-CM | POA: Diagnosis not present

## 2019-01-24 DIAGNOSIS — E43 Unspecified severe protein-calorie malnutrition: Secondary | ICD-10-CM | POA: Diagnosis not present

## 2019-01-24 DIAGNOSIS — R634 Abnormal weight loss: Secondary | ICD-10-CM | POA: Diagnosis not present

## 2019-01-25 DIAGNOSIS — R627 Adult failure to thrive: Secondary | ICD-10-CM | POA: Diagnosis not present

## 2019-01-25 DIAGNOSIS — R634 Abnormal weight loss: Secondary | ICD-10-CM | POA: Diagnosis not present

## 2019-01-25 DIAGNOSIS — E43 Unspecified severe protein-calorie malnutrition: Secondary | ICD-10-CM | POA: Diagnosis not present

## 2019-01-26 DIAGNOSIS — R634 Abnormal weight loss: Secondary | ICD-10-CM | POA: Diagnosis not present

## 2019-01-26 DIAGNOSIS — R627 Adult failure to thrive: Secondary | ICD-10-CM | POA: Diagnosis not present

## 2019-01-26 DIAGNOSIS — E43 Unspecified severe protein-calorie malnutrition: Secondary | ICD-10-CM | POA: Diagnosis not present

## 2019-01-27 DIAGNOSIS — E43 Unspecified severe protein-calorie malnutrition: Secondary | ICD-10-CM | POA: Diagnosis not present

## 2019-01-27 DIAGNOSIS — R627 Adult failure to thrive: Secondary | ICD-10-CM | POA: Diagnosis not present

## 2019-01-27 DIAGNOSIS — R634 Abnormal weight loss: Secondary | ICD-10-CM | POA: Diagnosis not present

## 2019-01-28 DIAGNOSIS — E43 Unspecified severe protein-calorie malnutrition: Secondary | ICD-10-CM | POA: Diagnosis not present

## 2019-01-28 DIAGNOSIS — R627 Adult failure to thrive: Secondary | ICD-10-CM | POA: Diagnosis not present

## 2019-01-28 DIAGNOSIS — R634 Abnormal weight loss: Secondary | ICD-10-CM | POA: Diagnosis not present

## 2019-01-29 DIAGNOSIS — R627 Adult failure to thrive: Secondary | ICD-10-CM | POA: Diagnosis not present

## 2019-01-29 DIAGNOSIS — R634 Abnormal weight loss: Secondary | ICD-10-CM | POA: Diagnosis not present

## 2019-01-29 DIAGNOSIS — E43 Unspecified severe protein-calorie malnutrition: Secondary | ICD-10-CM | POA: Diagnosis not present

## 2019-01-30 DIAGNOSIS — R627 Adult failure to thrive: Secondary | ICD-10-CM | POA: Diagnosis not present

## 2019-01-30 DIAGNOSIS — R634 Abnormal weight loss: Secondary | ICD-10-CM | POA: Diagnosis not present

## 2019-01-30 DIAGNOSIS — E43 Unspecified severe protein-calorie malnutrition: Secondary | ICD-10-CM | POA: Diagnosis not present

## 2019-01-31 DIAGNOSIS — R634 Abnormal weight loss: Secondary | ICD-10-CM | POA: Diagnosis not present

## 2019-01-31 DIAGNOSIS — E43 Unspecified severe protein-calorie malnutrition: Secondary | ICD-10-CM | POA: Diagnosis not present

## 2019-01-31 DIAGNOSIS — R627 Adult failure to thrive: Secondary | ICD-10-CM | POA: Diagnosis not present

## 2019-02-01 DIAGNOSIS — E43 Unspecified severe protein-calorie malnutrition: Secondary | ICD-10-CM | POA: Diagnosis not present

## 2019-02-01 DIAGNOSIS — R627 Adult failure to thrive: Secondary | ICD-10-CM | POA: Diagnosis not present

## 2019-02-01 DIAGNOSIS — R634 Abnormal weight loss: Secondary | ICD-10-CM | POA: Diagnosis not present

## 2019-02-02 DIAGNOSIS — R627 Adult failure to thrive: Secondary | ICD-10-CM | POA: Diagnosis not present

## 2019-02-02 DIAGNOSIS — R634 Abnormal weight loss: Secondary | ICD-10-CM | POA: Diagnosis not present

## 2019-02-02 DIAGNOSIS — E43 Unspecified severe protein-calorie malnutrition: Secondary | ICD-10-CM | POA: Diagnosis not present

## 2019-02-03 DIAGNOSIS — R634 Abnormal weight loss: Secondary | ICD-10-CM | POA: Diagnosis not present

## 2019-02-03 DIAGNOSIS — R627 Adult failure to thrive: Secondary | ICD-10-CM | POA: Diagnosis not present

## 2019-02-03 DIAGNOSIS — E43 Unspecified severe protein-calorie malnutrition: Secondary | ICD-10-CM | POA: Diagnosis not present

## 2019-02-04 DIAGNOSIS — E43 Unspecified severe protein-calorie malnutrition: Secondary | ICD-10-CM | POA: Diagnosis not present

## 2019-02-04 DIAGNOSIS — R627 Adult failure to thrive: Secondary | ICD-10-CM | POA: Diagnosis not present

## 2019-02-04 DIAGNOSIS — R634 Abnormal weight loss: Secondary | ICD-10-CM | POA: Diagnosis not present

## 2019-02-05 DIAGNOSIS — E43 Unspecified severe protein-calorie malnutrition: Secondary | ICD-10-CM | POA: Diagnosis not present

## 2019-02-05 DIAGNOSIS — R627 Adult failure to thrive: Secondary | ICD-10-CM | POA: Diagnosis not present

## 2019-02-05 DIAGNOSIS — R634 Abnormal weight loss: Secondary | ICD-10-CM | POA: Diagnosis not present

## 2019-02-06 DIAGNOSIS — R627 Adult failure to thrive: Secondary | ICD-10-CM | POA: Diagnosis not present

## 2019-02-06 DIAGNOSIS — E43 Unspecified severe protein-calorie malnutrition: Secondary | ICD-10-CM | POA: Diagnosis not present

## 2019-02-06 DIAGNOSIS — R634 Abnormal weight loss: Secondary | ICD-10-CM | POA: Diagnosis not present

## 2019-02-07 DIAGNOSIS — R634 Abnormal weight loss: Secondary | ICD-10-CM | POA: Diagnosis not present

## 2019-02-07 DIAGNOSIS — R627 Adult failure to thrive: Secondary | ICD-10-CM | POA: Diagnosis not present

## 2019-02-07 DIAGNOSIS — E43 Unspecified severe protein-calorie malnutrition: Secondary | ICD-10-CM | POA: Diagnosis not present

## 2019-02-08 DIAGNOSIS — R634 Abnormal weight loss: Secondary | ICD-10-CM | POA: Diagnosis not present

## 2019-02-08 DIAGNOSIS — R627 Adult failure to thrive: Secondary | ICD-10-CM | POA: Diagnosis not present

## 2019-02-08 DIAGNOSIS — E43 Unspecified severe protein-calorie malnutrition: Secondary | ICD-10-CM | POA: Diagnosis not present

## 2019-02-09 DIAGNOSIS — R627 Adult failure to thrive: Secondary | ICD-10-CM | POA: Diagnosis not present

## 2019-02-09 DIAGNOSIS — E43 Unspecified severe protein-calorie malnutrition: Secondary | ICD-10-CM | POA: Diagnosis not present

## 2019-02-09 DIAGNOSIS — R634 Abnormal weight loss: Secondary | ICD-10-CM | POA: Diagnosis not present

## 2019-02-10 DIAGNOSIS — R627 Adult failure to thrive: Secondary | ICD-10-CM | POA: Diagnosis not present

## 2019-02-10 DIAGNOSIS — E43 Unspecified severe protein-calorie malnutrition: Secondary | ICD-10-CM | POA: Diagnosis not present

## 2019-02-10 DIAGNOSIS — R634 Abnormal weight loss: Secondary | ICD-10-CM | POA: Diagnosis not present

## 2019-02-11 DIAGNOSIS — R634 Abnormal weight loss: Secondary | ICD-10-CM | POA: Diagnosis not present

## 2019-02-11 DIAGNOSIS — E43 Unspecified severe protein-calorie malnutrition: Secondary | ICD-10-CM | POA: Diagnosis not present

## 2019-02-11 DIAGNOSIS — R627 Adult failure to thrive: Secondary | ICD-10-CM | POA: Diagnosis not present

## 2019-02-12 DIAGNOSIS — E43 Unspecified severe protein-calorie malnutrition: Secondary | ICD-10-CM | POA: Diagnosis not present

## 2019-02-12 DIAGNOSIS — R634 Abnormal weight loss: Secondary | ICD-10-CM | POA: Diagnosis not present

## 2019-02-12 DIAGNOSIS — R627 Adult failure to thrive: Secondary | ICD-10-CM | POA: Diagnosis not present

## 2019-02-13 DIAGNOSIS — E43 Unspecified severe protein-calorie malnutrition: Secondary | ICD-10-CM | POA: Diagnosis not present

## 2019-02-13 DIAGNOSIS — R627 Adult failure to thrive: Secondary | ICD-10-CM | POA: Diagnosis not present

## 2019-02-13 DIAGNOSIS — R634 Abnormal weight loss: Secondary | ICD-10-CM | POA: Diagnosis not present

## 2019-02-14 DIAGNOSIS — E43 Unspecified severe protein-calorie malnutrition: Secondary | ICD-10-CM | POA: Diagnosis not present

## 2019-02-14 DIAGNOSIS — R627 Adult failure to thrive: Secondary | ICD-10-CM | POA: Diagnosis not present

## 2019-02-14 DIAGNOSIS — R634 Abnormal weight loss: Secondary | ICD-10-CM | POA: Diagnosis not present

## 2019-02-15 DIAGNOSIS — R634 Abnormal weight loss: Secondary | ICD-10-CM | POA: Diagnosis not present

## 2019-02-15 DIAGNOSIS — R627 Adult failure to thrive: Secondary | ICD-10-CM | POA: Diagnosis not present

## 2019-02-15 DIAGNOSIS — E43 Unspecified severe protein-calorie malnutrition: Secondary | ICD-10-CM | POA: Diagnosis not present

## 2019-02-16 DIAGNOSIS — R634 Abnormal weight loss: Secondary | ICD-10-CM | POA: Diagnosis not present

## 2019-02-16 DIAGNOSIS — E43 Unspecified severe protein-calorie malnutrition: Secondary | ICD-10-CM | POA: Diagnosis not present

## 2019-02-16 DIAGNOSIS — R627 Adult failure to thrive: Secondary | ICD-10-CM | POA: Diagnosis not present

## 2019-02-17 DIAGNOSIS — R627 Adult failure to thrive: Secondary | ICD-10-CM | POA: Diagnosis not present

## 2019-02-17 DIAGNOSIS — E43 Unspecified severe protein-calorie malnutrition: Secondary | ICD-10-CM | POA: Diagnosis not present

## 2019-02-17 DIAGNOSIS — R634 Abnormal weight loss: Secondary | ICD-10-CM | POA: Diagnosis not present

## 2019-02-18 DIAGNOSIS — R634 Abnormal weight loss: Secondary | ICD-10-CM | POA: Diagnosis not present

## 2019-02-18 DIAGNOSIS — E43 Unspecified severe protein-calorie malnutrition: Secondary | ICD-10-CM | POA: Diagnosis not present

## 2019-02-18 DIAGNOSIS — R627 Adult failure to thrive: Secondary | ICD-10-CM | POA: Diagnosis not present

## 2019-02-19 DIAGNOSIS — R627 Adult failure to thrive: Secondary | ICD-10-CM | POA: Diagnosis not present

## 2019-02-19 DIAGNOSIS — E43 Unspecified severe protein-calorie malnutrition: Secondary | ICD-10-CM | POA: Diagnosis not present

## 2019-02-19 DIAGNOSIS — R634 Abnormal weight loss: Secondary | ICD-10-CM | POA: Diagnosis not present

## 2019-02-20 DIAGNOSIS — R627 Adult failure to thrive: Secondary | ICD-10-CM | POA: Diagnosis not present

## 2019-02-20 DIAGNOSIS — R634 Abnormal weight loss: Secondary | ICD-10-CM | POA: Diagnosis not present

## 2019-02-20 DIAGNOSIS — E43 Unspecified severe protein-calorie malnutrition: Secondary | ICD-10-CM | POA: Diagnosis not present

## 2019-02-21 DIAGNOSIS — R634 Abnormal weight loss: Secondary | ICD-10-CM | POA: Diagnosis not present

## 2019-02-21 DIAGNOSIS — R627 Adult failure to thrive: Secondary | ICD-10-CM | POA: Diagnosis not present

## 2019-02-21 DIAGNOSIS — E43 Unspecified severe protein-calorie malnutrition: Secondary | ICD-10-CM | POA: Diagnosis not present

## 2019-02-22 DIAGNOSIS — E43 Unspecified severe protein-calorie malnutrition: Secondary | ICD-10-CM | POA: Diagnosis not present

## 2019-02-22 DIAGNOSIS — R634 Abnormal weight loss: Secondary | ICD-10-CM | POA: Diagnosis not present

## 2019-02-22 DIAGNOSIS — R627 Adult failure to thrive: Secondary | ICD-10-CM | POA: Diagnosis not present

## 2019-02-23 DIAGNOSIS — R634 Abnormal weight loss: Secondary | ICD-10-CM | POA: Diagnosis not present

## 2019-02-23 DIAGNOSIS — E43 Unspecified severe protein-calorie malnutrition: Secondary | ICD-10-CM | POA: Diagnosis not present

## 2019-02-23 DIAGNOSIS — R627 Adult failure to thrive: Secondary | ICD-10-CM | POA: Diagnosis not present

## 2019-02-24 DIAGNOSIS — R627 Adult failure to thrive: Secondary | ICD-10-CM | POA: Diagnosis not present

## 2019-02-24 DIAGNOSIS — R634 Abnormal weight loss: Secondary | ICD-10-CM | POA: Diagnosis not present

## 2019-02-24 DIAGNOSIS — E43 Unspecified severe protein-calorie malnutrition: Secondary | ICD-10-CM | POA: Diagnosis not present

## 2019-02-25 DIAGNOSIS — E43 Unspecified severe protein-calorie malnutrition: Secondary | ICD-10-CM | POA: Diagnosis not present

## 2019-02-25 DIAGNOSIS — R627 Adult failure to thrive: Secondary | ICD-10-CM | POA: Diagnosis not present

## 2019-02-25 DIAGNOSIS — R634 Abnormal weight loss: Secondary | ICD-10-CM | POA: Diagnosis not present

## 2019-02-26 ENCOUNTER — Encounter: Payer: Self-pay | Admitting: *Deleted

## 2019-02-26 DIAGNOSIS — E43 Unspecified severe protein-calorie malnutrition: Secondary | ICD-10-CM | POA: Diagnosis not present

## 2019-02-26 DIAGNOSIS — R627 Adult failure to thrive: Secondary | ICD-10-CM | POA: Diagnosis not present

## 2019-02-26 DIAGNOSIS — R634 Abnormal weight loss: Secondary | ICD-10-CM | POA: Diagnosis not present

## 2019-02-27 DIAGNOSIS — R634 Abnormal weight loss: Secondary | ICD-10-CM | POA: Diagnosis not present

## 2019-02-27 DIAGNOSIS — E43 Unspecified severe protein-calorie malnutrition: Secondary | ICD-10-CM | POA: Diagnosis not present

## 2019-02-27 DIAGNOSIS — R627 Adult failure to thrive: Secondary | ICD-10-CM | POA: Diagnosis not present

## 2019-02-28 DIAGNOSIS — E43 Unspecified severe protein-calorie malnutrition: Secondary | ICD-10-CM | POA: Diagnosis not present

## 2019-02-28 DIAGNOSIS — R634 Abnormal weight loss: Secondary | ICD-10-CM | POA: Diagnosis not present

## 2019-02-28 DIAGNOSIS — R627 Adult failure to thrive: Secondary | ICD-10-CM | POA: Diagnosis not present

## 2019-03-01 DIAGNOSIS — R627 Adult failure to thrive: Secondary | ICD-10-CM | POA: Diagnosis not present

## 2019-03-01 DIAGNOSIS — R634 Abnormal weight loss: Secondary | ICD-10-CM | POA: Diagnosis not present

## 2019-03-01 DIAGNOSIS — E43 Unspecified severe protein-calorie malnutrition: Secondary | ICD-10-CM | POA: Diagnosis not present

## 2019-03-02 DIAGNOSIS — R634 Abnormal weight loss: Secondary | ICD-10-CM | POA: Diagnosis not present

## 2019-03-02 DIAGNOSIS — R627 Adult failure to thrive: Secondary | ICD-10-CM | POA: Diagnosis not present

## 2019-03-02 DIAGNOSIS — E43 Unspecified severe protein-calorie malnutrition: Secondary | ICD-10-CM | POA: Diagnosis not present

## 2019-03-03 DIAGNOSIS — E43 Unspecified severe protein-calorie malnutrition: Secondary | ICD-10-CM | POA: Diagnosis not present

## 2019-03-03 DIAGNOSIS — R627 Adult failure to thrive: Secondary | ICD-10-CM | POA: Diagnosis not present

## 2019-03-03 DIAGNOSIS — R634 Abnormal weight loss: Secondary | ICD-10-CM | POA: Diagnosis not present

## 2019-03-04 DIAGNOSIS — E43 Unspecified severe protein-calorie malnutrition: Secondary | ICD-10-CM | POA: Diagnosis not present

## 2019-03-04 DIAGNOSIS — R634 Abnormal weight loss: Secondary | ICD-10-CM | POA: Diagnosis not present

## 2019-03-04 DIAGNOSIS — R627 Adult failure to thrive: Secondary | ICD-10-CM | POA: Diagnosis not present

## 2019-03-05 DIAGNOSIS — R627 Adult failure to thrive: Secondary | ICD-10-CM | POA: Diagnosis not present

## 2019-03-05 DIAGNOSIS — R634 Abnormal weight loss: Secondary | ICD-10-CM | POA: Diagnosis not present

## 2019-03-05 DIAGNOSIS — E43 Unspecified severe protein-calorie malnutrition: Secondary | ICD-10-CM | POA: Diagnosis not present

## 2019-03-06 DIAGNOSIS — R634 Abnormal weight loss: Secondary | ICD-10-CM | POA: Diagnosis not present

## 2019-03-06 DIAGNOSIS — E43 Unspecified severe protein-calorie malnutrition: Secondary | ICD-10-CM | POA: Diagnosis not present

## 2019-03-06 DIAGNOSIS — R627 Adult failure to thrive: Secondary | ICD-10-CM | POA: Diagnosis not present

## 2019-03-07 DIAGNOSIS — R634 Abnormal weight loss: Secondary | ICD-10-CM | POA: Diagnosis not present

## 2019-03-07 DIAGNOSIS — E43 Unspecified severe protein-calorie malnutrition: Secondary | ICD-10-CM | POA: Diagnosis not present

## 2019-03-07 DIAGNOSIS — R627 Adult failure to thrive: Secondary | ICD-10-CM | POA: Diagnosis not present

## 2019-03-08 DIAGNOSIS — R627 Adult failure to thrive: Secondary | ICD-10-CM | POA: Diagnosis not present

## 2019-03-08 DIAGNOSIS — E43 Unspecified severe protein-calorie malnutrition: Secondary | ICD-10-CM | POA: Diagnosis not present

## 2019-03-08 DIAGNOSIS — R634 Abnormal weight loss: Secondary | ICD-10-CM | POA: Diagnosis not present

## 2019-03-09 DIAGNOSIS — R634 Abnormal weight loss: Secondary | ICD-10-CM | POA: Diagnosis not present

## 2019-03-09 DIAGNOSIS — R627 Adult failure to thrive: Secondary | ICD-10-CM | POA: Diagnosis not present

## 2019-03-09 DIAGNOSIS — E43 Unspecified severe protein-calorie malnutrition: Secondary | ICD-10-CM | POA: Diagnosis not present

## 2019-03-10 DIAGNOSIS — R634 Abnormal weight loss: Secondary | ICD-10-CM | POA: Diagnosis not present

## 2019-03-10 DIAGNOSIS — E43 Unspecified severe protein-calorie malnutrition: Secondary | ICD-10-CM | POA: Diagnosis not present

## 2019-03-10 DIAGNOSIS — R627 Adult failure to thrive: Secondary | ICD-10-CM | POA: Diagnosis not present

## 2019-03-11 DIAGNOSIS — R634 Abnormal weight loss: Secondary | ICD-10-CM | POA: Diagnosis not present

## 2019-03-11 DIAGNOSIS — R627 Adult failure to thrive: Secondary | ICD-10-CM | POA: Diagnosis not present

## 2019-03-11 DIAGNOSIS — E43 Unspecified severe protein-calorie malnutrition: Secondary | ICD-10-CM | POA: Diagnosis not present

## 2019-03-12 DIAGNOSIS — R627 Adult failure to thrive: Secondary | ICD-10-CM | POA: Diagnosis not present

## 2019-03-12 DIAGNOSIS — R634 Abnormal weight loss: Secondary | ICD-10-CM | POA: Diagnosis not present

## 2019-03-12 DIAGNOSIS — E43 Unspecified severe protein-calorie malnutrition: Secondary | ICD-10-CM | POA: Diagnosis not present

## 2019-03-13 DIAGNOSIS — R634 Abnormal weight loss: Secondary | ICD-10-CM | POA: Diagnosis not present

## 2019-03-13 DIAGNOSIS — R627 Adult failure to thrive: Secondary | ICD-10-CM | POA: Diagnosis not present

## 2019-03-13 DIAGNOSIS — E43 Unspecified severe protein-calorie malnutrition: Secondary | ICD-10-CM | POA: Diagnosis not present

## 2019-03-14 ENCOUNTER — Other Ambulatory Visit: Payer: Self-pay | Admitting: Family Medicine

## 2019-03-14 DIAGNOSIS — R634 Abnormal weight loss: Secondary | ICD-10-CM | POA: Diagnosis not present

## 2019-03-14 DIAGNOSIS — R627 Adult failure to thrive: Secondary | ICD-10-CM | POA: Diagnosis not present

## 2019-03-14 DIAGNOSIS — E43 Unspecified severe protein-calorie malnutrition: Secondary | ICD-10-CM | POA: Diagnosis not present

## 2019-03-15 DIAGNOSIS — E43 Unspecified severe protein-calorie malnutrition: Secondary | ICD-10-CM | POA: Diagnosis not present

## 2019-03-15 DIAGNOSIS — R634 Abnormal weight loss: Secondary | ICD-10-CM | POA: Diagnosis not present

## 2019-03-15 DIAGNOSIS — R627 Adult failure to thrive: Secondary | ICD-10-CM | POA: Diagnosis not present

## 2019-03-16 DIAGNOSIS — R634 Abnormal weight loss: Secondary | ICD-10-CM | POA: Diagnosis not present

## 2019-03-16 DIAGNOSIS — E43 Unspecified severe protein-calorie malnutrition: Secondary | ICD-10-CM | POA: Diagnosis not present

## 2019-03-16 DIAGNOSIS — R627 Adult failure to thrive: Secondary | ICD-10-CM | POA: Diagnosis not present

## 2019-03-17 DIAGNOSIS — R627 Adult failure to thrive: Secondary | ICD-10-CM | POA: Diagnosis not present

## 2019-03-17 DIAGNOSIS — E43 Unspecified severe protein-calorie malnutrition: Secondary | ICD-10-CM | POA: Diagnosis not present

## 2019-03-17 DIAGNOSIS — R634 Abnormal weight loss: Secondary | ICD-10-CM | POA: Diagnosis not present

## 2019-03-18 DIAGNOSIS — E43 Unspecified severe protein-calorie malnutrition: Secondary | ICD-10-CM | POA: Diagnosis not present

## 2019-03-18 DIAGNOSIS — R634 Abnormal weight loss: Secondary | ICD-10-CM | POA: Diagnosis not present

## 2019-03-18 DIAGNOSIS — R627 Adult failure to thrive: Secondary | ICD-10-CM | POA: Diagnosis not present

## 2019-03-19 DIAGNOSIS — R627 Adult failure to thrive: Secondary | ICD-10-CM | POA: Diagnosis not present

## 2019-03-19 DIAGNOSIS — R634 Abnormal weight loss: Secondary | ICD-10-CM | POA: Diagnosis not present

## 2019-03-19 DIAGNOSIS — E43 Unspecified severe protein-calorie malnutrition: Secondary | ICD-10-CM | POA: Diagnosis not present

## 2019-03-20 DIAGNOSIS — R634 Abnormal weight loss: Secondary | ICD-10-CM | POA: Diagnosis not present

## 2019-03-20 DIAGNOSIS — E43 Unspecified severe protein-calorie malnutrition: Secondary | ICD-10-CM | POA: Diagnosis not present

## 2019-03-20 DIAGNOSIS — R627 Adult failure to thrive: Secondary | ICD-10-CM | POA: Diagnosis not present

## 2019-03-21 DIAGNOSIS — R634 Abnormal weight loss: Secondary | ICD-10-CM | POA: Diagnosis not present

## 2019-03-21 DIAGNOSIS — E43 Unspecified severe protein-calorie malnutrition: Secondary | ICD-10-CM | POA: Diagnosis not present

## 2019-03-21 DIAGNOSIS — R627 Adult failure to thrive: Secondary | ICD-10-CM | POA: Diagnosis not present

## 2019-03-22 DIAGNOSIS — R627 Adult failure to thrive: Secondary | ICD-10-CM | POA: Diagnosis not present

## 2019-03-22 DIAGNOSIS — E43 Unspecified severe protein-calorie malnutrition: Secondary | ICD-10-CM | POA: Diagnosis not present

## 2019-03-22 DIAGNOSIS — R634 Abnormal weight loss: Secondary | ICD-10-CM | POA: Diagnosis not present

## 2019-03-23 DIAGNOSIS — R634 Abnormal weight loss: Secondary | ICD-10-CM | POA: Diagnosis not present

## 2019-03-23 DIAGNOSIS — R627 Adult failure to thrive: Secondary | ICD-10-CM | POA: Diagnosis not present

## 2019-03-23 DIAGNOSIS — E43 Unspecified severe protein-calorie malnutrition: Secondary | ICD-10-CM | POA: Diagnosis not present

## 2019-03-24 DIAGNOSIS — R634 Abnormal weight loss: Secondary | ICD-10-CM | POA: Diagnosis not present

## 2019-03-24 DIAGNOSIS — E43 Unspecified severe protein-calorie malnutrition: Secondary | ICD-10-CM | POA: Diagnosis not present

## 2019-03-24 DIAGNOSIS — R627 Adult failure to thrive: Secondary | ICD-10-CM | POA: Diagnosis not present

## 2019-03-25 DIAGNOSIS — R627 Adult failure to thrive: Secondary | ICD-10-CM | POA: Diagnosis not present

## 2019-03-25 DIAGNOSIS — R634 Abnormal weight loss: Secondary | ICD-10-CM | POA: Diagnosis not present

## 2019-03-25 DIAGNOSIS — E43 Unspecified severe protein-calorie malnutrition: Secondary | ICD-10-CM | POA: Diagnosis not present

## 2019-03-26 DIAGNOSIS — E43 Unspecified severe protein-calorie malnutrition: Secondary | ICD-10-CM | POA: Diagnosis not present

## 2019-03-26 DIAGNOSIS — R627 Adult failure to thrive: Secondary | ICD-10-CM | POA: Diagnosis not present

## 2019-03-26 DIAGNOSIS — R634 Abnormal weight loss: Secondary | ICD-10-CM | POA: Diagnosis not present

## 2019-03-27 DIAGNOSIS — R627 Adult failure to thrive: Secondary | ICD-10-CM | POA: Diagnosis not present

## 2019-03-27 DIAGNOSIS — E43 Unspecified severe protein-calorie malnutrition: Secondary | ICD-10-CM | POA: Diagnosis not present

## 2019-03-27 DIAGNOSIS — R634 Abnormal weight loss: Secondary | ICD-10-CM | POA: Diagnosis not present

## 2019-03-28 DIAGNOSIS — E43 Unspecified severe protein-calorie malnutrition: Secondary | ICD-10-CM | POA: Diagnosis not present

## 2019-03-28 DIAGNOSIS — R634 Abnormal weight loss: Secondary | ICD-10-CM | POA: Diagnosis not present

## 2019-03-28 DIAGNOSIS — R627 Adult failure to thrive: Secondary | ICD-10-CM | POA: Diagnosis not present

## 2019-03-29 DIAGNOSIS — R634 Abnormal weight loss: Secondary | ICD-10-CM | POA: Diagnosis not present

## 2019-03-29 DIAGNOSIS — R627 Adult failure to thrive: Secondary | ICD-10-CM | POA: Diagnosis not present

## 2019-03-29 DIAGNOSIS — E43 Unspecified severe protein-calorie malnutrition: Secondary | ICD-10-CM | POA: Diagnosis not present

## 2019-03-30 DIAGNOSIS — R627 Adult failure to thrive: Secondary | ICD-10-CM | POA: Diagnosis not present

## 2019-03-30 DIAGNOSIS — E43 Unspecified severe protein-calorie malnutrition: Secondary | ICD-10-CM | POA: Diagnosis not present

## 2019-03-30 DIAGNOSIS — R634 Abnormal weight loss: Secondary | ICD-10-CM | POA: Diagnosis not present

## 2019-03-31 DIAGNOSIS — R634 Abnormal weight loss: Secondary | ICD-10-CM | POA: Diagnosis not present

## 2019-03-31 DIAGNOSIS — E43 Unspecified severe protein-calorie malnutrition: Secondary | ICD-10-CM | POA: Diagnosis not present

## 2019-03-31 DIAGNOSIS — R627 Adult failure to thrive: Secondary | ICD-10-CM | POA: Diagnosis not present

## 2019-04-01 DIAGNOSIS — E43 Unspecified severe protein-calorie malnutrition: Secondary | ICD-10-CM | POA: Diagnosis not present

## 2019-04-01 DIAGNOSIS — R627 Adult failure to thrive: Secondary | ICD-10-CM | POA: Diagnosis not present

## 2019-04-01 DIAGNOSIS — R634 Abnormal weight loss: Secondary | ICD-10-CM | POA: Diagnosis not present

## 2019-04-02 DIAGNOSIS — E43 Unspecified severe protein-calorie malnutrition: Secondary | ICD-10-CM | POA: Diagnosis not present

## 2019-04-02 DIAGNOSIS — R627 Adult failure to thrive: Secondary | ICD-10-CM | POA: Diagnosis not present

## 2019-04-02 DIAGNOSIS — R634 Abnormal weight loss: Secondary | ICD-10-CM | POA: Diagnosis not present

## 2019-04-03 DIAGNOSIS — R627 Adult failure to thrive: Secondary | ICD-10-CM | POA: Diagnosis not present

## 2019-04-03 DIAGNOSIS — E43 Unspecified severe protein-calorie malnutrition: Secondary | ICD-10-CM | POA: Diagnosis not present

## 2019-04-03 DIAGNOSIS — R634 Abnormal weight loss: Secondary | ICD-10-CM | POA: Diagnosis not present

## 2019-04-04 DIAGNOSIS — E43 Unspecified severe protein-calorie malnutrition: Secondary | ICD-10-CM | POA: Diagnosis not present

## 2019-04-04 DIAGNOSIS — R627 Adult failure to thrive: Secondary | ICD-10-CM | POA: Diagnosis not present

## 2019-04-04 DIAGNOSIS — R634 Abnormal weight loss: Secondary | ICD-10-CM | POA: Diagnosis not present

## 2019-04-05 DIAGNOSIS — R627 Adult failure to thrive: Secondary | ICD-10-CM | POA: Diagnosis not present

## 2019-04-05 DIAGNOSIS — E43 Unspecified severe protein-calorie malnutrition: Secondary | ICD-10-CM | POA: Diagnosis not present

## 2019-04-05 DIAGNOSIS — R634 Abnormal weight loss: Secondary | ICD-10-CM | POA: Diagnosis not present

## 2019-04-06 DIAGNOSIS — E43 Unspecified severe protein-calorie malnutrition: Secondary | ICD-10-CM | POA: Diagnosis not present

## 2019-04-06 DIAGNOSIS — R627 Adult failure to thrive: Secondary | ICD-10-CM | POA: Diagnosis not present

## 2019-04-06 DIAGNOSIS — R634 Abnormal weight loss: Secondary | ICD-10-CM | POA: Diagnosis not present

## 2019-04-07 DIAGNOSIS — E43 Unspecified severe protein-calorie malnutrition: Secondary | ICD-10-CM | POA: Diagnosis not present

## 2019-04-07 DIAGNOSIS — R627 Adult failure to thrive: Secondary | ICD-10-CM | POA: Diagnosis not present

## 2019-04-07 DIAGNOSIS — R634 Abnormal weight loss: Secondary | ICD-10-CM | POA: Diagnosis not present

## 2019-04-08 DIAGNOSIS — E43 Unspecified severe protein-calorie malnutrition: Secondary | ICD-10-CM | POA: Diagnosis not present

## 2019-04-08 DIAGNOSIS — R634 Abnormal weight loss: Secondary | ICD-10-CM | POA: Diagnosis not present

## 2019-04-08 DIAGNOSIS — R627 Adult failure to thrive: Secondary | ICD-10-CM | POA: Diagnosis not present

## 2019-04-09 DIAGNOSIS — R634 Abnormal weight loss: Secondary | ICD-10-CM | POA: Diagnosis not present

## 2019-04-09 DIAGNOSIS — E43 Unspecified severe protein-calorie malnutrition: Secondary | ICD-10-CM | POA: Diagnosis not present

## 2019-04-09 DIAGNOSIS — R627 Adult failure to thrive: Secondary | ICD-10-CM | POA: Diagnosis not present

## 2019-04-10 DIAGNOSIS — R634 Abnormal weight loss: Secondary | ICD-10-CM | POA: Diagnosis not present

## 2019-04-10 DIAGNOSIS — E43 Unspecified severe protein-calorie malnutrition: Secondary | ICD-10-CM | POA: Diagnosis not present

## 2019-04-10 DIAGNOSIS — R627 Adult failure to thrive: Secondary | ICD-10-CM | POA: Diagnosis not present

## 2019-04-11 DIAGNOSIS — R634 Abnormal weight loss: Secondary | ICD-10-CM | POA: Diagnosis not present

## 2019-04-11 DIAGNOSIS — E43 Unspecified severe protein-calorie malnutrition: Secondary | ICD-10-CM | POA: Diagnosis not present

## 2019-04-11 DIAGNOSIS — R627 Adult failure to thrive: Secondary | ICD-10-CM | POA: Diagnosis not present

## 2019-04-12 DIAGNOSIS — R627 Adult failure to thrive: Secondary | ICD-10-CM | POA: Diagnosis not present

## 2019-04-12 DIAGNOSIS — R634 Abnormal weight loss: Secondary | ICD-10-CM | POA: Diagnosis not present

## 2019-04-12 DIAGNOSIS — E43 Unspecified severe protein-calorie malnutrition: Secondary | ICD-10-CM | POA: Diagnosis not present

## 2019-04-13 DIAGNOSIS — E43 Unspecified severe protein-calorie malnutrition: Secondary | ICD-10-CM | POA: Diagnosis not present

## 2019-04-13 DIAGNOSIS — R634 Abnormal weight loss: Secondary | ICD-10-CM | POA: Diagnosis not present

## 2019-04-13 DIAGNOSIS — R627 Adult failure to thrive: Secondary | ICD-10-CM | POA: Diagnosis not present

## 2019-04-14 DIAGNOSIS — E43 Unspecified severe protein-calorie malnutrition: Secondary | ICD-10-CM | POA: Diagnosis not present

## 2019-04-14 DIAGNOSIS — R627 Adult failure to thrive: Secondary | ICD-10-CM | POA: Diagnosis not present

## 2019-04-14 DIAGNOSIS — R634 Abnormal weight loss: Secondary | ICD-10-CM | POA: Diagnosis not present

## 2019-04-15 DIAGNOSIS — R627 Adult failure to thrive: Secondary | ICD-10-CM | POA: Diagnosis not present

## 2019-04-15 DIAGNOSIS — E43 Unspecified severe protein-calorie malnutrition: Secondary | ICD-10-CM | POA: Diagnosis not present

## 2019-04-15 DIAGNOSIS — R634 Abnormal weight loss: Secondary | ICD-10-CM | POA: Diagnosis not present

## 2019-04-16 DIAGNOSIS — R627 Adult failure to thrive: Secondary | ICD-10-CM | POA: Diagnosis not present

## 2019-04-16 DIAGNOSIS — E43 Unspecified severe protein-calorie malnutrition: Secondary | ICD-10-CM | POA: Diagnosis not present

## 2019-04-16 DIAGNOSIS — R634 Abnormal weight loss: Secondary | ICD-10-CM | POA: Diagnosis not present

## 2019-04-17 DIAGNOSIS — R627 Adult failure to thrive: Secondary | ICD-10-CM | POA: Diagnosis not present

## 2019-04-17 DIAGNOSIS — R634 Abnormal weight loss: Secondary | ICD-10-CM | POA: Diagnosis not present

## 2019-04-17 DIAGNOSIS — E43 Unspecified severe protein-calorie malnutrition: Secondary | ICD-10-CM | POA: Diagnosis not present

## 2019-04-18 DIAGNOSIS — E43 Unspecified severe protein-calorie malnutrition: Secondary | ICD-10-CM | POA: Diagnosis not present

## 2019-04-18 DIAGNOSIS — R634 Abnormal weight loss: Secondary | ICD-10-CM | POA: Diagnosis not present

## 2019-04-18 DIAGNOSIS — R627 Adult failure to thrive: Secondary | ICD-10-CM | POA: Diagnosis not present

## 2019-04-19 DIAGNOSIS — R634 Abnormal weight loss: Secondary | ICD-10-CM | POA: Diagnosis not present

## 2019-04-19 DIAGNOSIS — R627 Adult failure to thrive: Secondary | ICD-10-CM | POA: Diagnosis not present

## 2019-04-19 DIAGNOSIS — E43 Unspecified severe protein-calorie malnutrition: Secondary | ICD-10-CM | POA: Diagnosis not present

## 2019-04-20 DIAGNOSIS — R627 Adult failure to thrive: Secondary | ICD-10-CM | POA: Diagnosis not present

## 2019-04-20 DIAGNOSIS — R634 Abnormal weight loss: Secondary | ICD-10-CM | POA: Diagnosis not present

## 2019-04-20 DIAGNOSIS — E43 Unspecified severe protein-calorie malnutrition: Secondary | ICD-10-CM | POA: Diagnosis not present

## 2019-04-21 DIAGNOSIS — R634 Abnormal weight loss: Secondary | ICD-10-CM | POA: Diagnosis not present

## 2019-04-21 DIAGNOSIS — R627 Adult failure to thrive: Secondary | ICD-10-CM | POA: Diagnosis not present

## 2019-04-21 DIAGNOSIS — E43 Unspecified severe protein-calorie malnutrition: Secondary | ICD-10-CM | POA: Diagnosis not present

## 2019-04-22 DIAGNOSIS — E43 Unspecified severe protein-calorie malnutrition: Secondary | ICD-10-CM | POA: Diagnosis not present

## 2019-04-22 DIAGNOSIS — R627 Adult failure to thrive: Secondary | ICD-10-CM | POA: Diagnosis not present

## 2019-04-22 DIAGNOSIS — R634 Abnormal weight loss: Secondary | ICD-10-CM | POA: Diagnosis not present

## 2019-04-23 DIAGNOSIS — R634 Abnormal weight loss: Secondary | ICD-10-CM | POA: Diagnosis not present

## 2019-04-23 DIAGNOSIS — R627 Adult failure to thrive: Secondary | ICD-10-CM | POA: Diagnosis not present

## 2019-04-23 DIAGNOSIS — E43 Unspecified severe protein-calorie malnutrition: Secondary | ICD-10-CM | POA: Diagnosis not present

## 2019-04-24 DIAGNOSIS — R627 Adult failure to thrive: Secondary | ICD-10-CM | POA: Diagnosis not present

## 2019-04-24 DIAGNOSIS — E43 Unspecified severe protein-calorie malnutrition: Secondary | ICD-10-CM | POA: Diagnosis not present

## 2019-04-24 DIAGNOSIS — R634 Abnormal weight loss: Secondary | ICD-10-CM | POA: Diagnosis not present

## 2019-04-25 DIAGNOSIS — R627 Adult failure to thrive: Secondary | ICD-10-CM | POA: Diagnosis not present

## 2019-04-25 DIAGNOSIS — E43 Unspecified severe protein-calorie malnutrition: Secondary | ICD-10-CM | POA: Diagnosis not present

## 2019-04-25 DIAGNOSIS — R634 Abnormal weight loss: Secondary | ICD-10-CM | POA: Diagnosis not present

## 2019-04-26 DIAGNOSIS — R627 Adult failure to thrive: Secondary | ICD-10-CM | POA: Diagnosis not present

## 2019-04-26 DIAGNOSIS — R634 Abnormal weight loss: Secondary | ICD-10-CM | POA: Diagnosis not present

## 2019-04-26 DIAGNOSIS — E43 Unspecified severe protein-calorie malnutrition: Secondary | ICD-10-CM | POA: Diagnosis not present

## 2019-04-27 DIAGNOSIS — R627 Adult failure to thrive: Secondary | ICD-10-CM | POA: Diagnosis not present

## 2019-04-27 DIAGNOSIS — R634 Abnormal weight loss: Secondary | ICD-10-CM | POA: Diagnosis not present

## 2019-04-27 DIAGNOSIS — E43 Unspecified severe protein-calorie malnutrition: Secondary | ICD-10-CM | POA: Diagnosis not present

## 2019-04-28 DIAGNOSIS — E43 Unspecified severe protein-calorie malnutrition: Secondary | ICD-10-CM | POA: Diagnosis not present

## 2019-04-28 DIAGNOSIS — R627 Adult failure to thrive: Secondary | ICD-10-CM | POA: Diagnosis not present

## 2019-04-28 DIAGNOSIS — R634 Abnormal weight loss: Secondary | ICD-10-CM | POA: Diagnosis not present

## 2019-04-29 DIAGNOSIS — R627 Adult failure to thrive: Secondary | ICD-10-CM | POA: Diagnosis not present

## 2019-04-29 DIAGNOSIS — R634 Abnormal weight loss: Secondary | ICD-10-CM | POA: Diagnosis not present

## 2019-04-29 DIAGNOSIS — E43 Unspecified severe protein-calorie malnutrition: Secondary | ICD-10-CM | POA: Diagnosis not present

## 2019-04-30 DIAGNOSIS — R634 Abnormal weight loss: Secondary | ICD-10-CM | POA: Diagnosis not present

## 2019-04-30 DIAGNOSIS — R627 Adult failure to thrive: Secondary | ICD-10-CM | POA: Diagnosis not present

## 2019-04-30 DIAGNOSIS — E43 Unspecified severe protein-calorie malnutrition: Secondary | ICD-10-CM | POA: Diagnosis not present

## 2019-05-01 DIAGNOSIS — E43 Unspecified severe protein-calorie malnutrition: Secondary | ICD-10-CM | POA: Diagnosis not present

## 2019-05-01 DIAGNOSIS — R634 Abnormal weight loss: Secondary | ICD-10-CM | POA: Diagnosis not present

## 2019-05-01 DIAGNOSIS — R627 Adult failure to thrive: Secondary | ICD-10-CM | POA: Diagnosis not present

## 2019-05-02 DIAGNOSIS — R634 Abnormal weight loss: Secondary | ICD-10-CM | POA: Diagnosis not present

## 2019-05-02 DIAGNOSIS — E43 Unspecified severe protein-calorie malnutrition: Secondary | ICD-10-CM | POA: Diagnosis not present

## 2019-05-02 DIAGNOSIS — R627 Adult failure to thrive: Secondary | ICD-10-CM | POA: Diagnosis not present

## 2019-05-03 DIAGNOSIS — R627 Adult failure to thrive: Secondary | ICD-10-CM | POA: Diagnosis not present

## 2019-05-03 DIAGNOSIS — E43 Unspecified severe protein-calorie malnutrition: Secondary | ICD-10-CM | POA: Diagnosis not present

## 2019-05-03 DIAGNOSIS — R634 Abnormal weight loss: Secondary | ICD-10-CM | POA: Diagnosis not present

## 2019-05-04 DIAGNOSIS — R634 Abnormal weight loss: Secondary | ICD-10-CM | POA: Diagnosis not present

## 2019-05-04 DIAGNOSIS — E43 Unspecified severe protein-calorie malnutrition: Secondary | ICD-10-CM | POA: Diagnosis not present

## 2019-05-04 DIAGNOSIS — R627 Adult failure to thrive: Secondary | ICD-10-CM | POA: Diagnosis not present

## 2019-05-05 DIAGNOSIS — R627 Adult failure to thrive: Secondary | ICD-10-CM | POA: Diagnosis not present

## 2019-05-05 DIAGNOSIS — E43 Unspecified severe protein-calorie malnutrition: Secondary | ICD-10-CM | POA: Diagnosis not present

## 2019-05-05 DIAGNOSIS — R634 Abnormal weight loss: Secondary | ICD-10-CM | POA: Diagnosis not present

## 2019-05-06 DIAGNOSIS — R634 Abnormal weight loss: Secondary | ICD-10-CM | POA: Diagnosis not present

## 2019-05-06 DIAGNOSIS — R627 Adult failure to thrive: Secondary | ICD-10-CM | POA: Diagnosis not present

## 2019-05-06 DIAGNOSIS — E43 Unspecified severe protein-calorie malnutrition: Secondary | ICD-10-CM | POA: Diagnosis not present

## 2019-05-07 DIAGNOSIS — E43 Unspecified severe protein-calorie malnutrition: Secondary | ICD-10-CM | POA: Diagnosis not present

## 2019-05-07 DIAGNOSIS — R634 Abnormal weight loss: Secondary | ICD-10-CM | POA: Diagnosis not present

## 2019-05-07 DIAGNOSIS — R627 Adult failure to thrive: Secondary | ICD-10-CM | POA: Diagnosis not present

## 2019-05-08 DIAGNOSIS — E43 Unspecified severe protein-calorie malnutrition: Secondary | ICD-10-CM | POA: Diagnosis not present

## 2019-05-08 DIAGNOSIS — R627 Adult failure to thrive: Secondary | ICD-10-CM | POA: Diagnosis not present

## 2019-05-08 DIAGNOSIS — R634 Abnormal weight loss: Secondary | ICD-10-CM | POA: Diagnosis not present

## 2019-05-09 DIAGNOSIS — R634 Abnormal weight loss: Secondary | ICD-10-CM | POA: Diagnosis not present

## 2019-05-09 DIAGNOSIS — E43 Unspecified severe protein-calorie malnutrition: Secondary | ICD-10-CM | POA: Diagnosis not present

## 2019-05-09 DIAGNOSIS — R627 Adult failure to thrive: Secondary | ICD-10-CM | POA: Diagnosis not present

## 2019-05-10 DIAGNOSIS — E43 Unspecified severe protein-calorie malnutrition: Secondary | ICD-10-CM | POA: Diagnosis not present

## 2019-05-10 DIAGNOSIS — R634 Abnormal weight loss: Secondary | ICD-10-CM | POA: Diagnosis not present

## 2019-05-10 DIAGNOSIS — R627 Adult failure to thrive: Secondary | ICD-10-CM | POA: Diagnosis not present

## 2019-05-11 DIAGNOSIS — E43 Unspecified severe protein-calorie malnutrition: Secondary | ICD-10-CM | POA: Diagnosis not present

## 2019-05-11 DIAGNOSIS — R627 Adult failure to thrive: Secondary | ICD-10-CM | POA: Diagnosis not present

## 2019-05-11 DIAGNOSIS — R634 Abnormal weight loss: Secondary | ICD-10-CM | POA: Diagnosis not present

## 2019-05-12 DIAGNOSIS — R634 Abnormal weight loss: Secondary | ICD-10-CM | POA: Diagnosis not present

## 2019-05-12 DIAGNOSIS — R627 Adult failure to thrive: Secondary | ICD-10-CM | POA: Diagnosis not present

## 2019-05-12 DIAGNOSIS — E43 Unspecified severe protein-calorie malnutrition: Secondary | ICD-10-CM | POA: Diagnosis not present

## 2019-05-13 DIAGNOSIS — E43 Unspecified severe protein-calorie malnutrition: Secondary | ICD-10-CM | POA: Diagnosis not present

## 2019-05-13 DIAGNOSIS — R627 Adult failure to thrive: Secondary | ICD-10-CM | POA: Diagnosis not present

## 2019-05-13 DIAGNOSIS — C712 Malignant neoplasm of temporal lobe: Secondary | ICD-10-CM | POA: Diagnosis not present

## 2019-05-13 DIAGNOSIS — R634 Abnormal weight loss: Secondary | ICD-10-CM | POA: Diagnosis not present

## 2019-05-14 DIAGNOSIS — R69 Illness, unspecified: Secondary | ICD-10-CM | POA: Diagnosis not present

## 2019-05-14 DIAGNOSIS — R634 Abnormal weight loss: Secondary | ICD-10-CM | POA: Diagnosis not present

## 2019-05-14 DIAGNOSIS — R627 Adult failure to thrive: Secondary | ICD-10-CM | POA: Diagnosis not present

## 2019-05-14 DIAGNOSIS — C712 Malignant neoplasm of temporal lobe: Secondary | ICD-10-CM | POA: Diagnosis not present

## 2019-05-14 DIAGNOSIS — E43 Unspecified severe protein-calorie malnutrition: Secondary | ICD-10-CM | POA: Diagnosis not present

## 2019-05-14 DIAGNOSIS — R569 Unspecified convulsions: Secondary | ICD-10-CM | POA: Diagnosis not present

## 2019-05-14 DIAGNOSIS — R4701 Aphasia: Secondary | ICD-10-CM | POA: Diagnosis not present

## 2019-05-15 DIAGNOSIS — E43 Unspecified severe protein-calorie malnutrition: Secondary | ICD-10-CM | POA: Diagnosis not present

## 2019-05-15 DIAGNOSIS — R634 Abnormal weight loss: Secondary | ICD-10-CM | POA: Diagnosis not present

## 2019-05-15 DIAGNOSIS — R627 Adult failure to thrive: Secondary | ICD-10-CM | POA: Diagnosis not present

## 2019-05-16 DIAGNOSIS — E43 Unspecified severe protein-calorie malnutrition: Secondary | ICD-10-CM | POA: Diagnosis not present

## 2019-05-16 DIAGNOSIS — R627 Adult failure to thrive: Secondary | ICD-10-CM | POA: Diagnosis not present

## 2019-05-16 DIAGNOSIS — R634 Abnormal weight loss: Secondary | ICD-10-CM | POA: Diagnosis not present

## 2019-05-17 DIAGNOSIS — E43 Unspecified severe protein-calorie malnutrition: Secondary | ICD-10-CM | POA: Diagnosis not present

## 2019-05-17 DIAGNOSIS — R627 Adult failure to thrive: Secondary | ICD-10-CM | POA: Diagnosis not present

## 2019-05-17 DIAGNOSIS — R634 Abnormal weight loss: Secondary | ICD-10-CM | POA: Diagnosis not present

## 2019-05-21 DIAGNOSIS — R634 Abnormal weight loss: Secondary | ICD-10-CM | POA: Diagnosis not present

## 2019-05-21 DIAGNOSIS — E43 Unspecified severe protein-calorie malnutrition: Secondary | ICD-10-CM | POA: Diagnosis not present

## 2019-05-21 DIAGNOSIS — R627 Adult failure to thrive: Secondary | ICD-10-CM | POA: Diagnosis not present

## 2019-05-22 DIAGNOSIS — E43 Unspecified severe protein-calorie malnutrition: Secondary | ICD-10-CM | POA: Diagnosis not present

## 2019-05-22 DIAGNOSIS — R634 Abnormal weight loss: Secondary | ICD-10-CM | POA: Diagnosis not present

## 2019-05-22 DIAGNOSIS — R627 Adult failure to thrive: Secondary | ICD-10-CM | POA: Diagnosis not present

## 2019-05-23 DIAGNOSIS — R627 Adult failure to thrive: Secondary | ICD-10-CM | POA: Diagnosis not present

## 2019-05-23 DIAGNOSIS — E43 Unspecified severe protein-calorie malnutrition: Secondary | ICD-10-CM | POA: Diagnosis not present

## 2019-05-23 DIAGNOSIS — R634 Abnormal weight loss: Secondary | ICD-10-CM | POA: Diagnosis not present

## 2019-05-24 DIAGNOSIS — R627 Adult failure to thrive: Secondary | ICD-10-CM | POA: Diagnosis not present

## 2019-05-24 DIAGNOSIS — R634 Abnormal weight loss: Secondary | ICD-10-CM | POA: Diagnosis not present

## 2019-05-24 DIAGNOSIS — E43 Unspecified severe protein-calorie malnutrition: Secondary | ICD-10-CM | POA: Diagnosis not present

## 2019-05-25 DIAGNOSIS — E43 Unspecified severe protein-calorie malnutrition: Secondary | ICD-10-CM | POA: Diagnosis not present

## 2019-05-25 DIAGNOSIS — R627 Adult failure to thrive: Secondary | ICD-10-CM | POA: Diagnosis not present

## 2019-05-25 DIAGNOSIS — R634 Abnormal weight loss: Secondary | ICD-10-CM | POA: Diagnosis not present

## 2019-05-26 DIAGNOSIS — R634 Abnormal weight loss: Secondary | ICD-10-CM | POA: Diagnosis not present

## 2019-05-26 DIAGNOSIS — R627 Adult failure to thrive: Secondary | ICD-10-CM | POA: Diagnosis not present

## 2019-05-26 DIAGNOSIS — E43 Unspecified severe protein-calorie malnutrition: Secondary | ICD-10-CM | POA: Diagnosis not present

## 2019-05-27 DIAGNOSIS — R627 Adult failure to thrive: Secondary | ICD-10-CM | POA: Diagnosis not present

## 2019-05-27 DIAGNOSIS — E43 Unspecified severe protein-calorie malnutrition: Secondary | ICD-10-CM | POA: Diagnosis not present

## 2019-05-27 DIAGNOSIS — R634 Abnormal weight loss: Secondary | ICD-10-CM | POA: Diagnosis not present

## 2019-05-28 DIAGNOSIS — R634 Abnormal weight loss: Secondary | ICD-10-CM | POA: Diagnosis not present

## 2019-05-28 DIAGNOSIS — E43 Unspecified severe protein-calorie malnutrition: Secondary | ICD-10-CM | POA: Diagnosis not present

## 2019-05-28 DIAGNOSIS — R627 Adult failure to thrive: Secondary | ICD-10-CM | POA: Diagnosis not present

## 2019-05-29 DIAGNOSIS — R627 Adult failure to thrive: Secondary | ICD-10-CM | POA: Diagnosis not present

## 2019-05-29 DIAGNOSIS — E43 Unspecified severe protein-calorie malnutrition: Secondary | ICD-10-CM | POA: Diagnosis not present

## 2019-05-29 DIAGNOSIS — R634 Abnormal weight loss: Secondary | ICD-10-CM | POA: Diagnosis not present

## 2019-05-30 DIAGNOSIS — R634 Abnormal weight loss: Secondary | ICD-10-CM | POA: Diagnosis not present

## 2019-05-30 DIAGNOSIS — R627 Adult failure to thrive: Secondary | ICD-10-CM | POA: Diagnosis not present

## 2019-05-30 DIAGNOSIS — E43 Unspecified severe protein-calorie malnutrition: Secondary | ICD-10-CM | POA: Diagnosis not present

## 2019-05-31 DIAGNOSIS — R634 Abnormal weight loss: Secondary | ICD-10-CM | POA: Diagnosis not present

## 2019-05-31 DIAGNOSIS — R627 Adult failure to thrive: Secondary | ICD-10-CM | POA: Diagnosis not present

## 2019-05-31 DIAGNOSIS — E43 Unspecified severe protein-calorie malnutrition: Secondary | ICD-10-CM | POA: Diagnosis not present

## 2019-06-01 DIAGNOSIS — R634 Abnormal weight loss: Secondary | ICD-10-CM | POA: Diagnosis not present

## 2019-06-01 DIAGNOSIS — R627 Adult failure to thrive: Secondary | ICD-10-CM | POA: Diagnosis not present

## 2019-06-01 DIAGNOSIS — E43 Unspecified severe protein-calorie malnutrition: Secondary | ICD-10-CM | POA: Diagnosis not present

## 2019-06-02 DIAGNOSIS — R627 Adult failure to thrive: Secondary | ICD-10-CM | POA: Diagnosis not present

## 2019-06-02 DIAGNOSIS — R634 Abnormal weight loss: Secondary | ICD-10-CM | POA: Diagnosis not present

## 2019-06-02 DIAGNOSIS — E43 Unspecified severe protein-calorie malnutrition: Secondary | ICD-10-CM | POA: Diagnosis not present

## 2019-06-03 DIAGNOSIS — R634 Abnormal weight loss: Secondary | ICD-10-CM | POA: Diagnosis not present

## 2019-06-03 DIAGNOSIS — E43 Unspecified severe protein-calorie malnutrition: Secondary | ICD-10-CM | POA: Diagnosis not present

## 2019-06-03 DIAGNOSIS — R627 Adult failure to thrive: Secondary | ICD-10-CM | POA: Diagnosis not present

## 2019-06-04 DIAGNOSIS — E43 Unspecified severe protein-calorie malnutrition: Secondary | ICD-10-CM | POA: Diagnosis not present

## 2019-06-04 DIAGNOSIS — R627 Adult failure to thrive: Secondary | ICD-10-CM | POA: Diagnosis not present

## 2019-06-04 DIAGNOSIS — R634 Abnormal weight loss: Secondary | ICD-10-CM | POA: Diagnosis not present

## 2019-06-05 DIAGNOSIS — R634 Abnormal weight loss: Secondary | ICD-10-CM | POA: Diagnosis not present

## 2019-06-05 DIAGNOSIS — E43 Unspecified severe protein-calorie malnutrition: Secondary | ICD-10-CM | POA: Diagnosis not present

## 2019-06-05 DIAGNOSIS — R627 Adult failure to thrive: Secondary | ICD-10-CM | POA: Diagnosis not present

## 2019-06-06 DIAGNOSIS — R627 Adult failure to thrive: Secondary | ICD-10-CM | POA: Diagnosis not present

## 2019-06-06 DIAGNOSIS — E43 Unspecified severe protein-calorie malnutrition: Secondary | ICD-10-CM | POA: Diagnosis not present

## 2019-06-06 DIAGNOSIS — R634 Abnormal weight loss: Secondary | ICD-10-CM | POA: Diagnosis not present

## 2019-06-07 DIAGNOSIS — E43 Unspecified severe protein-calorie malnutrition: Secondary | ICD-10-CM | POA: Diagnosis not present

## 2019-06-07 DIAGNOSIS — R627 Adult failure to thrive: Secondary | ICD-10-CM | POA: Diagnosis not present

## 2019-06-07 DIAGNOSIS — R634 Abnormal weight loss: Secondary | ICD-10-CM | POA: Diagnosis not present

## 2019-06-08 DIAGNOSIS — R627 Adult failure to thrive: Secondary | ICD-10-CM | POA: Diagnosis not present

## 2019-06-08 DIAGNOSIS — R634 Abnormal weight loss: Secondary | ICD-10-CM | POA: Diagnosis not present

## 2019-06-08 DIAGNOSIS — E43 Unspecified severe protein-calorie malnutrition: Secondary | ICD-10-CM | POA: Diagnosis not present

## 2019-06-09 DIAGNOSIS — R627 Adult failure to thrive: Secondary | ICD-10-CM | POA: Diagnosis not present

## 2019-06-09 DIAGNOSIS — R634 Abnormal weight loss: Secondary | ICD-10-CM | POA: Diagnosis not present

## 2019-06-09 DIAGNOSIS — E43 Unspecified severe protein-calorie malnutrition: Secondary | ICD-10-CM | POA: Diagnosis not present

## 2019-06-10 DIAGNOSIS — E43 Unspecified severe protein-calorie malnutrition: Secondary | ICD-10-CM | POA: Diagnosis not present

## 2019-06-10 DIAGNOSIS — R627 Adult failure to thrive: Secondary | ICD-10-CM | POA: Diagnosis not present

## 2019-06-10 DIAGNOSIS — R634 Abnormal weight loss: Secondary | ICD-10-CM | POA: Diagnosis not present

## 2019-06-11 DIAGNOSIS — E43 Unspecified severe protein-calorie malnutrition: Secondary | ICD-10-CM | POA: Diagnosis not present

## 2019-06-11 DIAGNOSIS — R627 Adult failure to thrive: Secondary | ICD-10-CM | POA: Diagnosis not present

## 2019-06-11 DIAGNOSIS — R634 Abnormal weight loss: Secondary | ICD-10-CM | POA: Diagnosis not present

## 2019-06-12 DIAGNOSIS — R627 Adult failure to thrive: Secondary | ICD-10-CM | POA: Diagnosis not present

## 2019-06-12 DIAGNOSIS — R634 Abnormal weight loss: Secondary | ICD-10-CM | POA: Diagnosis not present

## 2019-06-12 DIAGNOSIS — E43 Unspecified severe protein-calorie malnutrition: Secondary | ICD-10-CM | POA: Diagnosis not present

## 2019-06-13 DIAGNOSIS — R634 Abnormal weight loss: Secondary | ICD-10-CM | POA: Diagnosis not present

## 2019-06-13 DIAGNOSIS — R627 Adult failure to thrive: Secondary | ICD-10-CM | POA: Diagnosis not present

## 2019-06-13 DIAGNOSIS — E43 Unspecified severe protein-calorie malnutrition: Secondary | ICD-10-CM | POA: Diagnosis not present

## 2019-06-14 DIAGNOSIS — E43 Unspecified severe protein-calorie malnutrition: Secondary | ICD-10-CM | POA: Diagnosis not present

## 2019-06-14 DIAGNOSIS — R627 Adult failure to thrive: Secondary | ICD-10-CM | POA: Diagnosis not present

## 2019-06-14 DIAGNOSIS — R634 Abnormal weight loss: Secondary | ICD-10-CM | POA: Diagnosis not present

## 2019-06-15 DIAGNOSIS — R634 Abnormal weight loss: Secondary | ICD-10-CM | POA: Diagnosis not present

## 2019-06-15 DIAGNOSIS — R627 Adult failure to thrive: Secondary | ICD-10-CM | POA: Diagnosis not present

## 2019-06-15 DIAGNOSIS — E43 Unspecified severe protein-calorie malnutrition: Secondary | ICD-10-CM | POA: Diagnosis not present

## 2019-06-16 ENCOUNTER — Ambulatory Visit (INDEPENDENT_AMBULATORY_CARE_PROVIDER_SITE_OTHER): Payer: Medicare HMO | Admitting: Vascular Surgery

## 2019-06-16 ENCOUNTER — Encounter (INDEPENDENT_AMBULATORY_CARE_PROVIDER_SITE_OTHER): Payer: Medicare HMO

## 2019-06-16 DIAGNOSIS — R627 Adult failure to thrive: Secondary | ICD-10-CM | POA: Diagnosis not present

## 2019-06-16 DIAGNOSIS — R634 Abnormal weight loss: Secondary | ICD-10-CM | POA: Diagnosis not present

## 2019-06-16 DIAGNOSIS — E43 Unspecified severe protein-calorie malnutrition: Secondary | ICD-10-CM | POA: Diagnosis not present

## 2019-06-17 DIAGNOSIS — E43 Unspecified severe protein-calorie malnutrition: Secondary | ICD-10-CM | POA: Diagnosis not present

## 2019-06-17 DIAGNOSIS — R634 Abnormal weight loss: Secondary | ICD-10-CM | POA: Diagnosis not present

## 2019-06-17 DIAGNOSIS — R627 Adult failure to thrive: Secondary | ICD-10-CM | POA: Diagnosis not present

## 2019-06-18 DIAGNOSIS — R627 Adult failure to thrive: Secondary | ICD-10-CM | POA: Diagnosis not present

## 2019-06-18 DIAGNOSIS — E43 Unspecified severe protein-calorie malnutrition: Secondary | ICD-10-CM | POA: Diagnosis not present

## 2019-06-18 DIAGNOSIS — R634 Abnormal weight loss: Secondary | ICD-10-CM | POA: Diagnosis not present

## 2019-06-19 DIAGNOSIS — E43 Unspecified severe protein-calorie malnutrition: Secondary | ICD-10-CM | POA: Diagnosis not present

## 2019-06-19 DIAGNOSIS — R627 Adult failure to thrive: Secondary | ICD-10-CM | POA: Diagnosis not present

## 2019-06-19 DIAGNOSIS — R634 Abnormal weight loss: Secondary | ICD-10-CM | POA: Diagnosis not present

## 2019-07-09 DIAGNOSIS — R569 Unspecified convulsions: Secondary | ICD-10-CM | POA: Diagnosis not present

## 2019-07-09 DIAGNOSIS — R69 Illness, unspecified: Secondary | ICD-10-CM | POA: Diagnosis not present

## 2019-07-09 DIAGNOSIS — Z7952 Long term (current) use of systemic steroids: Secondary | ICD-10-CM | POA: Diagnosis not present

## 2019-07-09 DIAGNOSIS — R627 Adult failure to thrive: Secondary | ICD-10-CM | POA: Diagnosis not present

## 2019-07-09 DIAGNOSIS — E43 Unspecified severe protein-calorie malnutrition: Secondary | ICD-10-CM | POA: Diagnosis not present

## 2019-07-09 DIAGNOSIS — Z79899 Other long term (current) drug therapy: Secondary | ICD-10-CM | POA: Diagnosis not present

## 2019-07-09 DIAGNOSIS — I10 Essential (primary) hypertension: Secondary | ICD-10-CM | POA: Diagnosis not present

## 2019-07-09 DIAGNOSIS — R4701 Aphasia: Secondary | ICD-10-CM | POA: Diagnosis not present

## 2019-07-09 DIAGNOSIS — B370 Candidal stomatitis: Secondary | ICD-10-CM | POA: Diagnosis not present

## 2019-07-09 DIAGNOSIS — C712 Malignant neoplasm of temporal lobe: Secondary | ICD-10-CM | POA: Diagnosis not present

## 2019-09-03 ENCOUNTER — Telehealth: Payer: Self-pay

## 2019-09-03 DIAGNOSIS — R0602 Shortness of breath: Secondary | ICD-10-CM | POA: Diagnosis not present

## 2019-09-03 DIAGNOSIS — I712 Thoracic aortic aneurysm, without rupture: Secondary | ICD-10-CM | POA: Diagnosis not present

## 2019-09-03 DIAGNOSIS — R569 Unspecified convulsions: Secondary | ICD-10-CM | POA: Diagnosis not present

## 2019-09-03 DIAGNOSIS — I719 Aortic aneurysm of unspecified site, without rupture: Secondary | ICD-10-CM | POA: Diagnosis not present

## 2019-09-03 DIAGNOSIS — I10 Essential (primary) hypertension: Secondary | ICD-10-CM | POA: Diagnosis not present

## 2019-09-03 DIAGNOSIS — E43 Unspecified severe protein-calorie malnutrition: Secondary | ICD-10-CM | POA: Diagnosis not present

## 2019-09-03 DIAGNOSIS — C712 Malignant neoplasm of temporal lobe: Secondary | ICD-10-CM | POA: Diagnosis not present

## 2019-09-03 DIAGNOSIS — R627 Adult failure to thrive: Secondary | ICD-10-CM | POA: Diagnosis not present

## 2019-09-03 DIAGNOSIS — C719 Malignant neoplasm of brain, unspecified: Secondary | ICD-10-CM | POA: Diagnosis not present

## 2019-09-03 DIAGNOSIS — Z923 Personal history of irradiation: Secondary | ICD-10-CM | POA: Diagnosis not present

## 2019-09-03 DIAGNOSIS — R69 Illness, unspecified: Secondary | ICD-10-CM | POA: Diagnosis not present

## 2019-09-03 NOTE — Telephone Encounter (Signed)
Aware, thanks!

## 2019-09-03 NOTE — Telephone Encounter (Signed)
Ann Wu (DPR signed) left v/m that pt was at [facility] earlier today and [facility] was concerned about possible clot in lungs. I spoke with Cissy and since she left this message she talked with [facility] and they have arranged pt to have a CT scan this afternoon and if positive for blood clot will admit pt and if neg for clot will think more toward COPD. Pt has been very SOB to the point of labored breathing. Cissy will let Dr Glori Bickers know the outcome.fYI to Dr Glori Bickers.

## 2019-11-04 DIAGNOSIS — R627 Adult failure to thrive: Secondary | ICD-10-CM | POA: Diagnosis not present

## 2019-11-04 DIAGNOSIS — I639 Cerebral infarction, unspecified: Secondary | ICD-10-CM | POA: Diagnosis not present

## 2019-11-04 DIAGNOSIS — C712 Malignant neoplasm of temporal lobe: Secondary | ICD-10-CM | POA: Diagnosis not present

## 2019-11-04 DIAGNOSIS — E43 Unspecified severe protein-calorie malnutrition: Secondary | ICD-10-CM | POA: Diagnosis not present

## 2019-11-04 DIAGNOSIS — I10 Essential (primary) hypertension: Secondary | ICD-10-CM | POA: Diagnosis not present

## 2019-11-04 DIAGNOSIS — Z79899 Other long term (current) drug therapy: Secondary | ICD-10-CM | POA: Diagnosis not present

## 2019-11-04 DIAGNOSIS — E2740 Unspecified adrenocortical insufficiency: Secondary | ICD-10-CM | POA: Diagnosis not present

## 2019-11-04 DIAGNOSIS — J449 Chronic obstructive pulmonary disease, unspecified: Secondary | ICD-10-CM | POA: Diagnosis not present

## 2019-11-04 DIAGNOSIS — R69 Illness, unspecified: Secondary | ICD-10-CM | POA: Diagnosis not present

## 2019-11-04 DIAGNOSIS — R569 Unspecified convulsions: Secondary | ICD-10-CM | POA: Diagnosis not present

## 2019-12-21 DIAGNOSIS — R2681 Unsteadiness on feet: Secondary | ICD-10-CM | POA: Diagnosis not present

## 2019-12-21 DIAGNOSIS — R531 Weakness: Secondary | ICD-10-CM | POA: Diagnosis not present

## 2019-12-21 DIAGNOSIS — R4701 Aphasia: Secondary | ICD-10-CM | POA: Diagnosis not present

## 2019-12-21 DIAGNOSIS — C719 Malignant neoplasm of brain, unspecified: Secondary | ICD-10-CM | POA: Diagnosis not present

## 2019-12-21 DIAGNOSIS — Z8673 Personal history of transient ischemic attack (TIA), and cerebral infarction without residual deficits: Secondary | ICD-10-CM | POA: Diagnosis not present

## 2019-12-29 DIAGNOSIS — C712 Malignant neoplasm of temporal lobe: Secondary | ICD-10-CM | POA: Diagnosis not present

## 2019-12-30 DIAGNOSIS — C719 Malignant neoplasm of brain, unspecified: Secondary | ICD-10-CM | POA: Diagnosis not present

## 2019-12-30 DIAGNOSIS — C712 Malignant neoplasm of temporal lobe: Secondary | ICD-10-CM | POA: Diagnosis not present

## 2019-12-30 DIAGNOSIS — I10 Essential (primary) hypertension: Secondary | ICD-10-CM | POA: Diagnosis not present

## 2021-10-10 DEATH — deceased
